# Patient Record
Sex: Female | Born: 1990 | Race: Black or African American | Hispanic: No | Marital: Single | Smoking: Current every day smoker
Health system: Southern US, Community
[De-identification: ages and names within clinical notes are randomized; demographics above are authoritative.]

## PROBLEM LIST (undated history)

## (undated) DIAGNOSIS — L732 Hidradenitis suppurativa: Secondary | ICD-10-CM

## (undated) DIAGNOSIS — F419 Anxiety disorder, unspecified: Secondary | ICD-10-CM

## (undated) HISTORY — DX: Hidradenitis suppurativa: L73.2

## (undated) HISTORY — PX: NO PAST SURGERIES: SHX2092

---
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
## 2008-05-28 ENCOUNTER — Emergency Department (HOSPITAL_COMMUNITY): Admission: EM | Admit: 2008-05-28 | Discharge: 2008-05-28 | Payer: Self-pay | Admitting: Emergency Medicine

## 2010-02-15 ENCOUNTER — Encounter: Payer: Self-pay | Admitting: Obstetrics & Gynecology

## 2010-06-10 ENCOUNTER — Emergency Department (HOSPITAL_COMMUNITY)
Admission: EM | Admit: 2010-06-10 | Discharge: 2010-06-11 | Disposition: A | Discharge status: Home / Self Care | Payer: Medicaid Other | Attending: Emergency Medicine | Admitting: Emergency Medicine

## 2010-06-10 DIAGNOSIS — N390 Urinary tract infection, site not specified: Secondary | ICD-10-CM | POA: Insufficient documentation

## 2010-06-10 DIAGNOSIS — K2920 Alcoholic gastritis without bleeding: Secondary | ICD-10-CM | POA: Insufficient documentation

## 2010-06-10 DIAGNOSIS — F1020 Alcohol dependence, uncomplicated: Secondary | ICD-10-CM | POA: Insufficient documentation

## 2010-06-10 LAB — URINE MICROSCOPIC-ADD ON

## 2010-06-10 LAB — CBC
MCHC: 34.6 g/dL (ref 30.0–36.0)
Platelets: 244 10*3/uL (ref 150–400)
RBC: 5.24 MIL/uL — ABNORMAL HIGH (ref 3.87–5.11)
WBC: 14.9 10*3/uL — ABNORMAL HIGH (ref 4.0–10.5)

## 2010-06-10 LAB — URINALYSIS, ROUTINE W REFLEX MICROSCOPIC
Bilirubin Urine: NEGATIVE
Glucose, UA: NEGATIVE mg/dL
Hgb urine dipstick: NEGATIVE
Nitrite: POSITIVE — AB

## 2010-06-10 LAB — BASIC METABOLIC PANEL
Calcium: 10 mg/dL (ref 8.4–10.5)
GFR calc non Af Amer: >60 mL/min (ref >60)
Potassium: 3.3 mEq/L — ABNORMAL LOW (ref 3.5–5.1)
Sodium: 134 mEq/L — ABNORMAL LOW (ref 135–145)

## 2010-06-10 LAB — POCT PREGNANCY, URINE: Preg Test, Ur: NEGATIVE

## 2010-06-10 LAB — DIFFERENTIAL
Eosinophils Absolute: 0 10*3/uL (ref 0.0–0.7)
Eosinophils Relative: 0 % (ref 0–5)
Lymphocytes Relative: 7 % — ABNORMAL LOW (ref 12–46)

## 2010-06-12 NOTE — Discharge Summary (Signed)
Sara [name], Sara [name] NO.:  [id number]   MEDICAL RECORD NO.:  [id number]          PATIENT TYPE:  INP   LOCATION:  9152                          FACILITY:  [facility]   PHYSICIAN:  Charles A. Clearance Coots, M.D.DATE OF BIRTH:  Oct 21, 1990   DATE OF ADMISSION:  01/13/2006  DATE OF DISCHARGE:  01/16/2006                               DISCHARGE SUMMARY   ADMITTING DIAGNOSES:  1. Thirty-five-weeks gestation.  2. Pregnancy-induced hypertension.  3. Rule out mild preeclampsia.   DISCHARGE DIAGNOSES:  1. Thirty-five-weeks gestation.  2. Pregnancy-induced hypertension.  3. Rule out mild preeclampsia.  4. Discharged home at [redacted] weeks gestation undelivered, blood pressures      much improved, in good condition   REASON FOR ADMISSION:  A 20 year old para 84 female with an estimated  date of confinement of February 13, 2006, admitted with pregnancy-induced  hypertension.  The patient had onset of care in our office at [redacted] weeks  gestation.  Her initial blood pressure was 129/69.  Subsequent blood  pressures were 140s over 70s.  At her office visit today her blood  pressure was 151/90.  Urine dip was negative for protein.  She had a  mild headache and that had resolved prior to her exam in the office.  A  PIH panel on December 12 was remarkable for uric acid of 6.1.  The  remainder of the labs were normal.   PAST MEDICAL HISTORY:  Surgery:  None.  Illnesses:  None.   MEDICATIONS:  Prenatal vitamins.   ALLERGIES:  No known drug allergies.   SOCIAL HISTORY:  Single.  Negative for tobacco, alcohol or recreational  drug use.   GYNECOLOGIC HISTORY:  Unremarkable.   OBSTETRICAL HISTORY:  She has had one voluntary termination of  pregnancy.   PHYSICAL EXAMINATION:  GENERAL:  Well-nourished, well-developed female  in no acute distress.  VITAL SIGNS:  Blood pressure 151/90.  Urine dip was negative for protein  and glucose.  ABDOMEN:  Gravid, nontender.  LUNGS:  Clear to auscultation  bilaterally.  HEART:  Regular rate and rhythm.  PELVIC:  Exam was deferred.  EXTREMITIES: Without significant lower extremity edema.   Nonstress test in the office:  Baseline 160-170 beats per minute with  accelerations, no decelerations.   ADMITTING LABORATORY VALUES:  Hemoglobin 12; hematocrit 35; white blood  cell count 6400; platelets 215,000.  Comprehensive metabolic panel was  remarkable for uric acid of 6.9.  Urinalysis revealed negative protein,  specific gravity of 1.010.   HOSPITAL COURSE:  The patient was admitted on bedrest and 24-hour urine  was started.  Blood pressures remained stable in the hospital.  The 24-  hour urine result revealed total protein of 126 mg per 24 hours,  creatinine clearance was 160 mL per minute.  Biophysical profile was  done which was within normal limits with an amniotic fluid index of 10.8  and a biophysical profile ultrasound score of 8/8.  The patient  continued to do well with blood pressures and was discharged home on  hospital day #4 in good condition, undelivered, at [redacted] weeks gestation.   DISCHARGE DISPOSITION:  Continue modified  bedrest at home.  The patient  is to call office for a followup appointment in 1 week.      Charles A. Clearance Coots, M.D.  Electronically Signed     CAH/MEDQ  D:  02/24/2006  T:  02/24/2006  Job:  [id number]

## 2010-06-12 NOTE — H&P (Signed)
Sara [name], [name] NO.:  [id number]   MEDICAL RECORD NO.:  [id number]          PATIENT TYPE:  INP   LOCATION:  9152                          FACILITY:  [facility]   PHYSICIAN:  Roseanna Rainbow, M.D.DATE OF BIRTH:  13-Feb-1990   DATE OF ADMISSION:  01/13/2006  DATE OF DISCHARGE:                              HISTORY & PHYSICAL   CHIEF COMPLAINT:  The patient is a para 0 with an estimated date of  confinement of February 13, 2006 with pregnancy-induced hypertension.   HISTORY OF PRESENT ILLNESS:  The patient had onset of care in our office  at 32 weeks. Her initial blood pressure was 129/69. Subsequent blood  pressures were 140s/70s. At our visit today, her blood pressure was  151/90. Urine dip was negative for protein. She had had a mild headache  that had resolved prior to her exam in the office. A PIH panel on  December 12 was remarkable for a uric acid of 6.1. The remainder of the  labs were normal.   ALLERGIES:  No known drug allergies.   MEDICATIONS:  Please see the medication reconciliation form.   RISK FACTORS:  Pregnancy-induced hypertension, positive chlamydia DNA  probe. Fetal pyelectasis and sickle cell positive.   PRENATAL LABORATORY DATA:  Please see the above. GC probe negative.  Hepatitis B surface antigen negative. Hematocrit 35.7, hemoglobin 11.9.  HIV nonreactive. Pap smear negative. Platelets 186,000. Blood type O  positive, antibody screen negative. RPR nonreactive. Rubella immune.  Sickle cell positive. Urine culture and sensitivity:  No growth.  Ultrasound on December 13:  Estimated fetal weight 25th of 50th  percentile; left renal pyelectasis was noted; the amniotic fluid index  was normal. The caliectasis measured 1.4 cm at the level of the renal  pelvis. 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   PAST GYNECOLOGIC HISTORY:  Unremarkable.   PAST MEDICAL HISTORY:  No significant history of medical  diseases.   PAST SURGICAL HISTORY:  No previous surgery.   SOCIAL HISTORY:  She is single. Does not give any significant history of  alcohol usage. Has no significant smoking history. Denies illicit drug  use.   PAST OBSTETRICAL HISTORY:  She has a history of a voluntary termination  of pregnancy.   PHYSICAL EXAMINATION:  Blood pressure 151/90, weight 150 pounds. Urine  dip negative for protein, glucose, red blood cells and ketones.  GENERAL:  Well-developed in no acute distress.  ABDOMEN:  Gravid, nontender.  PELVIC:  Deferred.  EXTREMITIES:  No significant lower extremity edema.   Nonstress test in the office:  Fetal heart rate baseline 170 with  accelerations, no decelerations.   ASSESSMENT:  Intrauterine pregnancy at 35+ weeks with pregnancy-induced  hypertension, rule out mild preeclampsia.   PLAN:  Admission with heightened maternal and fetal surveillance.      Roseanna Rainbow, M.D.  Electronically Signed     LAJ/MEDQ  D:  01/14/2006  T:  01/14/2006  Job:  [id number]

## 2012-01-20 ENCOUNTER — Encounter (HOSPITAL_COMMUNITY): Payer: Self-pay | Admitting: *Deleted

## 2012-01-20 ENCOUNTER — Emergency Department (HOSPITAL_COMMUNITY)
Admission: EM | Admit: 2012-01-20 | Discharge: 2012-01-20 | Disposition: A | Discharge status: Home / Self Care | Payer: Self-pay | Attending: Emergency Medicine | Admitting: Emergency Medicine

## 2012-01-20 DIAGNOSIS — B86 Scabies: Secondary | ICD-10-CM | POA: Insufficient documentation

## 2012-01-20 DIAGNOSIS — F17200 Nicotine dependence, unspecified, uncomplicated: Secondary | ICD-10-CM | POA: Insufficient documentation

## 2012-01-20 MED ORDER — DIPHENHYDRAMINE HCL 25 MG PO CAPS: 25.0000 mg | ORAL_CAPSULE | Freq: Four times a day (QID) | ORAL | Status: DC | PRN

## 2012-01-20 MED ORDER — PERMETHRIN 5 % EX CREA: TOPICAL_CREAM | CUTANEOUS | Status: DC

## 2012-01-20 NOTE — ED Notes (Signed)
She has had a rash over her body for 2 weeks she thinks she has scabies.

## 2012-01-20 NOTE — ED Provider Notes (Signed)
History  This chart was scribed for Sara Racer, MD by Bennett Scrape, ED Scribe. This patient was seen in room TR04C/TR04C and the patient's care was started at 5:05 PM.  CSN: [id number]  Arrival date & time 01/20/12  1606   First MD Initiated Contact with Patient 01/20/12 1705      Chief Complaint  Patient presents with  . Rash    Patient is a 21 y.o. female presenting with rash. The history is provided by the patient. No language interpreter was used.  Rash  This is a new problem. The current episode started more than 1 week ago. The problem has been gradually worsening. The problem is associated with an unknown factor. There has been no fever. The rash is present on the trunk, left hand, left arm, abdomen, right arm, right hand, right upper leg, right lower leg, left upper leg and left lower leg. The patient is experiencing no pain. Associated symptoms include itching.    Sara [name] is a 21 y.o. female who presents to the Emergency Department complaining of approximate;y 2 weeks of gradual onset, gradually worsening, constant rash. She denies having prior episodes of similar symptoms but reports that she has sick contacts with similar symptoms diagnosed as scabies. She reports using Vaseline with no improvement. She denies any other symptoms currently. She does not have a h/o chronic medical conditions. She is a current everyday smoker and occasional alcohol user.  History reviewed. No pertinent past medical history.  History reviewed. No pertinent past surgical history.  No family history on file.  History  Substance Use Topics  . Smoking status: Current Every Day Smoker  . Smokeless tobacco: Not on file  . Alcohol Use: Yes    No OB history provided.  Review of Systems  Constitutional: Negative for fever and chills.  Gastrointestinal: Negative for nausea and vomiting.  Skin: Positive for itching and rash.  All other systems reviewed and are  negative.    Allergies  Review of patient's allergies indicates no known allergies.  Home Medications   Current Outpatient Rx  Name  Route  Sig  Dispense  Refill  . DIPHENHYDRAMINE HCL 25 MG PO CAPS   Oral   Take 1 capsule (25 mg total) by mouth every 6 (six) hours as needed for itching.   30 capsule   0   . PERMETHRIN 5 % EX CREA      Apply to affected area once   10 g   0     Triage Vitals: BP 108/78  Pulse 94  Temp 98 F (36.7 C) (Oral)  Resp 18  SpO2 99%  Physical Exam  Nursing note and vitals reviewed. Constitutional: She is oriented to person, place, and time. She appears well-developed and well-nourished. No distress.  HENT:  Head: Normocephalic and atraumatic.  Eyes: EOM are normal.  Neck: Neck supple. No tracheal deviation present.  Cardiovascular: Normal rate.   Pulmonary/Chest: Effort normal. No respiratory distress.  Musculoskeletal: Normal range of motion.  Neurological: She is alert and oriented to person, place, and time.  Skin: Skin is warm and dry. Rash noted.       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 Her behavior is normal.    ED Course  Procedures (including critical care time)  DIAGNOSTIC STUDIES: Oxygen Saturation is 99% on room air, normal by my interpretation.    COORDINATION OF CARE: 5:15 PM-  Discussed treatment plan which includes cream and cleaning her living area with pt at bedside and pt agreed to plan.  5:45 PM- Prescribed 5% Elimite cream and 25 mg benadryl capsule  Labs Reviewed - No data to display No results found.   1. Scabies       MDM  I personally performed the services described in this documentation, which was scribed in my presence. The recorded information has been reviewed and is accurate.       Sara Racer, MD 01/20/12 [phone]

## 2012-09-03 ENCOUNTER — Encounter (HOSPITAL_COMMUNITY): Payer: Self-pay | Admitting: Nurse Practitioner

## 2012-09-03 ENCOUNTER — Emergency Department (HOSPITAL_COMMUNITY): Payer: Self-pay

## 2012-09-03 ENCOUNTER — Emergency Department (HOSPITAL_COMMUNITY)
Admission: EM | Admit: 2012-09-03 | Discharge: 2012-09-03 | Disposition: A | Discharge status: Home / Self Care | Payer: Self-pay | Attending: Emergency Medicine | Admitting: Emergency Medicine

## 2012-09-03 DIAGNOSIS — R109 Unspecified abdominal pain: Secondary | ICD-10-CM | POA: Insufficient documentation

## 2012-09-03 DIAGNOSIS — G8929 Other chronic pain: Secondary | ICD-10-CM | POA: Insufficient documentation

## 2012-09-03 DIAGNOSIS — M545 Low back pain, unspecified: Secondary | ICD-10-CM | POA: Insufficient documentation

## 2012-09-03 DIAGNOSIS — F17200 Nicotine dependence, unspecified, uncomplicated: Secondary | ICD-10-CM | POA: Insufficient documentation

## 2012-09-03 DIAGNOSIS — R110 Nausea: Secondary | ICD-10-CM | POA: Insufficient documentation

## 2012-09-03 LAB — URINALYSIS, ROUTINE W REFLEX MICROSCOPIC
Bilirubin Urine: NEGATIVE
Glucose, UA: NEGATIVE mg/dL
Ketones, ur: NEGATIVE mg/dL
Nitrite: POSITIVE — AB
Protein, ur: NEGATIVE mg/dL
Specific Gravity, Urine: 1.016 (ref 1.005–1.030)
Urobilinogen, UA: 0.2 mg/dL (ref 0.0–1.0)
pH: 5.5 (ref 5.0–8.0)

## 2012-09-03 LAB — URINE MICROSCOPIC-ADD ON

## 2012-09-03 MED ORDER — MORPHINE SULFATE 4 MG/ML IJ SOLN: 4.0000 mg | Freq: Once | INTRAMUSCULAR | Status: DC

## 2012-09-03 MED ORDER — HYDROCODONE-ACETAMINOPHEN 5-325 MG PO TABS
2.0000 | ORAL_TABLET | Freq: Once | ORAL | Status: AC
  Administered 2012-09-03: 2 via ORAL
  Filled 2012-09-03: qty 2

## 2012-09-03 MED ORDER — ACETAMINOPHEN 500 MG PO TABS: 500.0000 mg | ORAL_TABLET | Freq: Four times a day (QID) | ORAL | Status: DC | PRN

## 2012-09-03 NOTE — ED Notes (Signed)
Discharge instructions reviewed. Pt verbalized understanding.  

## 2012-09-03 NOTE — ED Notes (Signed)
Pt c/o generalized abd pain for 6 years, increased with sneezing and laughing, onset after she had a child. ems noted no deformities, no tenderness on palpation. vss en route

## 2012-09-03 NOTE — ED Provider Notes (Signed)
Medical screening examination/treatment/procedure(s) were conducted as a shared visit with non-physician practitioner(s) and myself.  I personally evaluated the patient during the encounter   Darlys Gales, MD 09/03/12 2049

## 2012-09-03 NOTE — ED Provider Notes (Signed)
CSN: [id number]     Arrival date & time 09/03/12  1452 History     First MD Initiated Contact with Patient 09/03/12 1534     Chief Complaint  Patient presents with  . Abdominal Pain   (Consider location/radiation/quality/duration/timing/severity/associated sxs/prior Treatment) HPI Pt is a 21yo female c/o intermittent right-sided abdominal pain as well as LBP for the past 89yrs, which started after giving birth to her son in 2008.  Pt states pain worse with coughing, sneezing, and deep breathing.  Aching and sharp on occation, 8/10. Ibuprofen provides some relief.  Reports occasional nausea.  Denies previous evaluation for symptoms.  Denies recent injury.  Denies fever, vomiting, or diarrhea.  Denies urinary symptoms, vaginal discharge or pain.  Denies previous abdominal surgeries.  LMP-currently on.  History reviewed. No pertinent past medical history. History reviewed. No pertinent past surgical history. History reviewed. No pertinent family history. History  Substance Use Topics  . Smoking status: Current Every Day Smoker  . Smokeless tobacco: Not on file  . Alcohol Use: Yes   OB History   Grav Para Term Preterm Abortions TAB SAB Ect Mult Living                 Review of Systems  Constitutional: Negative for fever, chills and appetite change.  Gastrointestinal: Positive for nausea and abdominal pain. Negative for vomiting, diarrhea and constipation.  Genitourinary: Positive for flank pain ( right). Negative for dysuria, urgency, frequency, hematuria and pelvic pain.  Musculoskeletal: Positive for back pain.  All other systems reviewed and are negative.    Allergies  Review of patient's allergies indicates no known allergies.  Home Medications   Current Outpatient Rx  Name  Route  Sig  Dispense  Refill  . acetaminophen (TYLENOL) 500 MG tablet   Oral   Take 1 tablet (500 mg total) by mouth every 6 (six) hours as needed for pain.   30 tablet   0    BP 112/70  Pulse  95  Temp(Src) 98.9 F (37.2 C) (Oral)  Resp 16  SpO2 99% Physical Exam  Nursing note and vitals reviewed. Constitutional: She appears well-developed and well-nourished. No distress.  HENT:  Head: Normocephalic and atraumatic.  Eyes: Conjunctivae are normal. No scleral icterus.  Neck: Normal range of motion.  Cardiovascular: Normal rate, regular rhythm and normal heart sounds.   Pulmonary/Chest: Effort normal and breath sounds normal. No respiratory distress. She has no wheezes. She has no rales. She exhibits no tenderness.  Abdominal: Soft. Bowel sounds are normal. She exhibits no distension and no mass. There is tenderness ( mild, just right of umbilicus.  ). There is no rebound and no guarding.  Soft, nl bowel sounds, no masses palpated on exam but TTP where pt reports hx of "bulge"   Musculoskeletal: Normal range of motion.  Neurological: She is alert.  Skin: Skin is warm and dry. She is not diaphoretic.    ED Course   Procedures (including critical care time)  Labs Reviewed  URINALYSIS, ROUTINE W REFLEX MICROSCOPIC - Abnormal; Notable for the following:    APPearance CLOUDY (*)    Hgb urine dipstick LARGE (*)    Nitrite POSITIVE (*)    Leukocytes, UA TRACE (*)    All other components within normal limits  URINE MICROSCOPIC-ADD ON - Abnormal; Notable for the following:    Squamous Epithelial / LPF FEW (*)    Bacteria, UA MANY (*)    All other components within normal limits  URINE  CULTURE   US Abdomen Complete  09/03/2012   *RADIOLOGY REPORT*  Clinical Data:  Pain and focal bulge along the right abdomen.  COMPLETE ABDOMINAL ULTRASOUND  Comparison:  None.  Findings:  Gallbladder:  No gallstones, gallbladder wall thickening, or pericholecystic fluid.  Common bile duct:  Measures 3 mm in diameter, within normal limits.  Liver:  No focal lesion identified.  Within normal limits in parenchymal echogenicity.  IVC:  Appears normal.  Pancreas:  No focal abnormality seen.  Spleen:   Measures 11.1 cm craniocaudad and appears normal.  Right Kidney:  Measures 10.5 cm in length and appears normal.  Left Kidney:  Measures 11.8 cm in length and appears normal.  Abdominal aorta:  No aneurysm identified.  Other:  No hernia identified along the region of interest in the right flank.  IMPRESSION: Negative abdominal ultrasound.   Original Report Authenticated By: Gaylyn Rong, M.D.   1. Right sided abdominal pain     MDM  Suspicion for non-incarcerated abdominal hernia.  Not concerned for surgical abdomen at this time. Discussed pt with Dr. Redgie Grayer. Will get UA and U/S.    UA: unremarkable as pt is on menstrual cycle.   U/S: unremarkable.    Due to intermittent duration of this pain for 78yrs and no obvious cause found today.  Will have pt f/u with [facility] to establish PCP for further evaluation and any appropriate referral.   Junius Finner, PA-C 09/03/12 1931

## 2012-09-03 NOTE — ED Notes (Signed)
Pt c/o generalized abd and back pain that started in 2008. Pt states pain has been increased for the past two days with coughing and deep breaths. Pt reports that it feels like a tightness or like something has "shifted" in her abd. Pt states she has not taken anything for the pain and has not seen a dr for these symptoms due to the pain going away.

## 2012-09-03 NOTE — ED Notes (Addendum)
Pt denies nausea, vomiting, or diarrhea. Pt denies any vaginal discharge but states she is on her menstrual cycle.

## 2012-09-05 LAB — URINE CULTURE: Colony Count: >=100000

## 2012-09-06 NOTE — ED Notes (Signed)
+   Urine Culture No treatment needed per Hannah Merrill 

## 2013-05-21 ENCOUNTER — Emergency Department (HOSPITAL_COMMUNITY): Payer: Medicaid Other

## 2013-05-21 ENCOUNTER — Encounter (HOSPITAL_COMMUNITY): Payer: Self-pay | Admitting: Emergency Medicine

## 2013-05-21 ENCOUNTER — Emergency Department (HOSPITAL_COMMUNITY)
Admission: EM | Admit: 2013-05-21 | Discharge: 2013-05-21 | Disposition: A | Discharge status: Home / Self Care | Payer: Self-pay | Attending: Emergency Medicine | Admitting: Emergency Medicine

## 2013-05-21 DIAGNOSIS — S93609A Unspecified sprain of unspecified foot, initial encounter: Secondary | ICD-10-CM | POA: Insufficient documentation

## 2013-05-21 DIAGNOSIS — S93602A Unspecified sprain of left foot, initial encounter: Secondary | ICD-10-CM

## 2013-05-21 DIAGNOSIS — F17200 Nicotine dependence, unspecified, uncomplicated: Secondary | ICD-10-CM | POA: Insufficient documentation

## 2013-05-21 DIAGNOSIS — Y929 Unspecified place or not applicable: Secondary | ICD-10-CM | POA: Insufficient documentation

## 2013-05-21 DIAGNOSIS — Y939 Activity, unspecified: Secondary | ICD-10-CM | POA: Insufficient documentation

## 2013-05-21 DIAGNOSIS — X58XXXA Exposure to other specified factors, initial encounter: Secondary | ICD-10-CM | POA: Insufficient documentation

## 2013-05-21 MED ORDER — IBUPROFEN 400 MG PO TABS
800.0000 mg | ORAL_TABLET | Freq: Once | ORAL | Status: AC
  Administered 2013-05-21: 800 mg via ORAL
  Filled 2013-05-21: qty 2

## 2013-05-21 NOTE — ED Notes (Addendum)
Pt presents with Left foot numbness starting last night, pt denies any injury

## 2013-05-21 NOTE — ED Provider Notes (Signed)
Medical screening examination/treatment/procedure(s) were performed by non-physician practitioner and as supervising physician I was immediately available for consultation/collaboration.   EKG Interpretation None        Richardean Canalavid H Carrah Eppolito, MD 05/21/13 [phone]

## 2013-05-21 NOTE — ED Notes (Signed)
Called x 2 for patient.   No answer.  

## 2013-05-21 NOTE — Discharge Instructions (Signed)
Take ibuprofen 600-800 mg every 6-8 hours respectively as needed for pain.  Foot Sprain The muscles and cord like structures which attach muscle to bone (tendons) that surround the feet are made up of units. A foot sprain can occur at the weakest spot in any of these units. This condition is most often caused by injury to or overuse of the foot, as from playing contact sports, or aggravating a previous injury, or from poor conditioning, or obesity. SYMPTOMS  Pain with movement of the foot.  Tenderness and swelling at the injury site.  Loss of strength is present in moderate or severe sprains. THE THREE GRADES OR SEVERITY OF FOOT SPRAIN ARE:  Mild (Grade I): Slightly pulled muscle without tearing of muscle or tendon fibers or loss of strength.  Moderate (Grade II): Tearing of fibers in a muscle, tendon, or at the attachment to bone, with small decrease in strength.  Severe (Grade III): Rupture of the muscle-tendon-bone attachment, with separation of fibers. Severe sprain requires surgical repair. Often repeating (chronic) sprains are caused by overuse. Sudden (acute) sprains are caused by direct injury or over-use. DIAGNOSIS  Diagnosis of this condition is usually by your own observation. If problems continue, a caregiver may be required for further evaluation and treatment. X-rays may be required to make sure there are not breaks in the bones (fractures) present. Continued problems may require physical therapy for treatment. PREVENTION  Use strength and conditioning exercises appropriate for your sport.  Warm up properly prior to working out.  Use athletic shoes that are made for the sport you are participating in.  Allow adequate time for healing. Early return to activities makes repeat injury more likely, and can lead to an unstable arthritic foot that can result in prolonged disability. Mild sprains generally heal in 3 to 10 days, with moderate and severe sprains taking 2 to 10 weeks.  Your caregiver can help you determine the proper time required for healing. HOME CARE INSTRUCTIONS   Apply ice to the injury for 15-20 minutes, 03-04 times per day. Put the ice in a plastic bag and place a towel between the bag of ice and your skin.  An elastic wrap (like an Ace bandage) may be used to keep swelling down.  Keep foot above the level of the heart, or at least raised on a footstool, when swelling and pain are present.  Try to avoid use other than gentle range of motion while the foot is painful. Do not resume use until instructed by your caregiver. Then begin use gradually, not increasing use to the point of pain. If pain does develop, decrease use and continue the above measures, gradually increasing activities that do not cause discomfort, until you gradually achieve normal use.  Use crutches if and as instructed, and for the length of time instructed.  Keep injured foot and ankle wrapped between treatments.  Massage foot and ankle for comfort and to keep swelling down. Massage from the toes up towards the knee.  Only take over-the-counter or prescription medicines for pain, discomfort, or fever as directed by your caregiver. SEEK IMMEDIATE MEDICAL CARE IF:   Your pain and swelling increase, or pain is not controlled with medications.  You have loss of feeling in your foot or your foot turns cold or blue.  You develop new, unexplained symptoms, or an increase of the symptoms that brought you to your caregiver. MAKE SURE YOU:   Understand these instructions.  Will watch your condition.  Will get help  right away if you are not doing well or get worse. Document Released: 07/03/2001 Document Revised: 04/05/2011 Document Reviewed: 08/31/2007 [facility] Patient Information 2014 [place], [place].
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
## 2016-01-20 ENCOUNTER — Encounter (HOSPITAL_COMMUNITY): Payer: Self-pay

## 2016-01-20 ENCOUNTER — Emergency Department (HOSPITAL_COMMUNITY)
Admission: EM | Admit: 2016-01-20 | Discharge: 2016-01-21 | Disposition: A | Discharge status: Home / Self Care | Payer: Self-pay | Attending: Emergency Medicine | Admitting: Emergency Medicine

## 2016-01-20 DIAGNOSIS — F17200 Nicotine dependence, unspecified, uncomplicated: Secondary | ICD-10-CM | POA: Insufficient documentation

## 2016-01-20 DIAGNOSIS — X58XXXA Exposure to other specified factors, initial encounter: Secondary | ICD-10-CM

## 2016-01-20 DIAGNOSIS — D649 Anemia, unspecified: Secondary | ICD-10-CM | POA: Insufficient documentation

## 2016-01-20 DIAGNOSIS — A599 Trichomoniasis, unspecified: Secondary | ICD-10-CM | POA: Insufficient documentation

## 2016-01-20 DIAGNOSIS — N939 Abnormal uterine and vaginal bleeding, unspecified: Secondary | ICD-10-CM

## 2016-01-20 LAB — COMPREHENSIVE METABOLIC PANEL
ALT: 30 U/L (ref 14–54)
ANION GAP: 10 (ref 5–15)
AST: 28 U/L (ref 15–41)
Albumin: 4 g/dL (ref 3.5–5.0)
Alkaline Phosphatase: 72 U/L (ref 38–126)
BUN: 10 mg/dL (ref 6–20)
CALCIUM: 9.9 mg/dL (ref 8.9–10.3)
CO2: 25 mmol/L (ref 22–32)
CREATININE: 0.83 mg/dL (ref 0.44–1.00)
Chloride: 102 mmol/L (ref 101–111)
Glucose, Bld: 93 mg/dL (ref 65–99)
Potassium: 3.9 mmol/L (ref 3.5–5.1)
SODIUM: 137 mmol/L (ref 135–145)
Total Bilirubin: 0.4 mg/dL (ref 0.3–1.2)
Total Protein: 7.8 g/dL (ref 6.5–8.1)

## 2016-01-20 LAB — URINALYSIS, ROUTINE W REFLEX MICROSCOPIC
BILIRUBIN URINE: NEGATIVE
GLUCOSE, UA: NEGATIVE mg/dL
Hgb urine dipstick: NEGATIVE
KETONES UR: NEGATIVE mg/dL
Nitrite: NEGATIVE
PH: 5 (ref 5.0–8.0)
PROTEIN: NEGATIVE mg/dL
Specific Gravity, Urine: 1.009 (ref 1.005–1.030)

## 2016-01-20 LAB — CBC
HCT: 28.9 % — ABNORMAL LOW (ref 36.0–46.0)
HEMOGLOBIN: 9.3 g/dL — AB (ref 12.0–15.0)
MCH: 26.3 pg (ref 26.0–34.0)
MCHC: 32.2 g/dL (ref 30.0–36.0)
MCV: 81.9 fL (ref 78.0–100.0)
PLATELETS: 477 10*3/uL — AB (ref 150–400)
RBC: 3.53 MIL/uL — AB (ref 3.87–5.11)
RDW: 13.7 % (ref 11.5–15.5)
WBC: 10 10*3/uL (ref 4.0–10.5)

## 2016-01-20 LAB — I-STAT BETA HCG BLOOD, ED (MC, WL, AP ONLY)

## 2016-01-20 LAB — LIPASE, BLOOD: LIPASE: 20 U/L (ref 11–51)

## 2016-01-20 NOTE — ED Triage Notes (Signed)
Pt presents for evaluation of vaginal bleeding x 1 month and abd pain. Pt. States had month long menstrual cycle in October, Pt states last cycle started late November and has just ended today. Pt. States she has had some generalized weakness/fatigue since. Pt AxO x4. No hx of anemia.

## 2016-01-21 ENCOUNTER — Emergency Department (HOSPITAL_COMMUNITY): Payer: Self-pay

## 2016-01-21 LAB — GC/CHLAMYDIA PROBE AMP (~~LOC~~) NOT AT ARMC
Chlamydia: NEGATIVE
Neisseria Gonorrhea: NEGATIVE

## 2016-01-21 LAB — WET PREP, GENITAL
SPERM: NONE SEEN
Yeast Wet Prep HPF POC: NONE SEEN

## 2016-01-21 MED ORDER — METRONIDAZOLE 500 MG PO TABS
2000.0000 mg | ORAL_TABLET | Freq: Once | ORAL | Status: AC
  Administered 2016-01-21: 2000 mg via ORAL
  Filled 2016-01-21: qty 4

## 2016-01-21 MED ORDER — FERROUS SULFATE 325 (65 FE) MG PO TABS: 325.0000 mg | ORAL_TABLET | Freq: Every day | ORAL | 0 refills | Status: DC

## 2016-01-21 NOTE — ED Provider Notes (Signed)
[facility]-EMERGENCY DEPT Provider Note   CSN: [id number] Arrival date & time: 01/20/16  1707     History   Chief Complaint Chief Complaint  Patient presents with  . Vaginal Bleeding  . Abdominal Pain    HPI Sara [name] is a 25 y.o. female.  The history is provided by the patient and medical records. No language interpreter was used.  Vaginal Bleeding  Primary symptoms include vaginal bleeding.  Primary symptoms include no dysuria. Associated symptoms include abdominal pain. Pertinent negatives include no diarrhea, no nausea, no vomiting and no frequency.  Abdominal Pain   Pertinent negatives include fever, diarrhea, nausea, vomiting, dysuria, frequency and headaches.   Sara [name] is an otherwise healthy 25 y.o. female  who presents to the Emergency Department complaining of vaginal bleeding. Patient states that she started her menstrual cycle 2 days before Thanksgiving and bleeding persisted for approximately a month, ending yesterday. She endorses weakness and fatigue over the last 2 days. Today, she was standing up at work when she felt very lightheaded. She sat down and began to feel better. She denies vaginal discharge, but states that she has not completely sure given bleeding. Endorses intermittent lower abdominal cramping.No dysuria, urinary urgency/frequency, back pain.   History reviewed. No pertinent past medical history.  There are no active problems to display for this patient.   History reviewed. No pertinent surgical history.  OB History    No data available       Home Medications    Prior to Admission medications   Medication Sig Start Date End Date Taking? Authorizing Provider  ferrous sulfate 325 (65 FE) MG tablet Take 1 tablet (325 mg total) by mouth daily. 01/21/16   Chase PicketJaime Pilcher Ward, PA-C    Family History No family history on file.  Social History Social History  Substance Use Topics  . Smoking status: Current Every Day Smoker   . Smokeless tobacco: Not on file  . Alcohol use Yes     Allergies   Patient has no known allergies.   Review of Systems Review of Systems  Constitutional: Negative for chills and fever.  HENT: Negative for congestion.   Eyes: Negative for visual disturbance.  Respiratory: Negative for cough and shortness of breath.   Cardiovascular: Negative.   Gastrointestinal: Positive for abdominal pain. Negative for diarrhea, nausea and vomiting.  Genitourinary: Positive for vaginal bleeding. Negative for dysuria, frequency, urgency and vaginal discharge.  Musculoskeletal: Negative for back pain and neck pain.  Skin: Negative for rash.  Neurological: Negative for headaches.     Physical Exam Updated Vital Signs BP 114/67   Pulse 70   Temp 98.5 F (36.9 C) (Oral)   Resp 16   LMP 12/14/2015 (Within Days)   SpO2 100%   Physical Exam  Constitutional: She is oriented to person, place, and time. She appears well-developed and well-nourished. No distress.  HENT:  Head: Normocephalic and atraumatic.  Cardiovascular: Normal rate, regular rhythm and normal heart sounds.   No murmur heard. Pulmonary/Chest: Effort normal and breath sounds normal. No respiratory distress.  Abdominal: Soft. Bowel sounds are normal. She exhibits no distension.  No abdominal or CVA tenderness.  Genitourinary:  Genitourinary Comments: Chaperone present for exam.  No active bleeding or bleeding within vaginal vault.  + white vaginal discharge.  Mild right adnexal tenderness with no masses or fullness appreciated.  No CMT.   Neurological: She is alert and oriented to person, place, and time.  Skin: Skin is warm and  dry.  Nursing note and vitals reviewed.    ED Treatments / Results  Labs (all labs ordered are listed, but only abnormal results are displayed) Labs Reviewed  WET PREP, GENITAL - Abnormal; Notable for the following:       Result Value   Trich, Wet Prep PRESENT (*)    Clue Cells Wet Prep HPF  POC PRESENT (*)    WBC, Wet Prep HPF POC MODERATE (*)    All other components within normal limits  CBC - Abnormal; Notable for the following:    RBC 3.53 (*)    Hemoglobin 9.3 (*)    HCT 28.9 (*)    Platelets 477 (*)    All other components within normal limits  URINALYSIS, ROUTINE W REFLEX MICROSCOPIC - Abnormal; Notable for the following:    Color, Urine STRAW (*)    Leukocytes, UA SMALL (*)    Bacteria, UA FEW (*)    Squamous Epithelial / LPF 0-5 (*)    All other components within normal limits  LIPASE, BLOOD  COMPREHENSIVE METABOLIC PANEL  I-STAT BETA HCG BLOOD, ED ([facility], [facility], [facility] ONLY)  GC/CHLAMYDIA PROBE AMP ([place]) NOT AT [facility]    EKG  EKG Interpretation None       Radiology US Transvaginal Non-ob  Result Date: 01/21/2016 CLINICAL DATA:  Vaginal bleeding x1 month with pain. EXAM: TRANSABDOMINAL AND TRANSVAGINAL ULTRASOUND OF PELVIS DOPPLER ULTRASOUND OF OVARIES TECHNIQUE: Both transabdominal and transvaginal ultrasound examinations of the pelvis were performed. Transabdominal technique was performed for global imaging of the pelvis including uterus, ovaries, adnexal regions, and pelvic cul-de-sac. It was necessary to proceed with endovaginal exam following the transabdominal exam to visualize the endometrium and ovaries. Color and duplex Doppler ultrasound was utilized to evaluate blood flow to the ovaries. COMPARISON:  None. FINDINGS: Uterus Measurements: 7.8 x 4.5 x 4.6 cm. The uterus is slightly anteverted. No fibroids or other mass visualized. Endometrium Thickness: Slightly heterogeneous and irregular in appearance with measuring up to 13 mm. Interposed hypoechoic fluid and/or products of menstruation noted. No focal abnormality visualized. Right ovary Measurements: 3.2 x 1.7 x 3 cm. Normal appearance/no adnexal mass. Left ovary Measurements: 3 x 2.6 x 3 cm. There appears be an involuting cyst or follicle in the left ovary measuring approximately 1.6 x 2.1 x 1.3 cm.  Pulsed Doppler evaluation of both ovaries demonstrates normal low-resistance arterial and venous waveforms. Other findings No abnormal free fluid. IMPRESSION: Irregular slightly thickened appearance of the endometrium, nonspecific in etiology. Patient reports having stopped menses on the day of this exam. The irregular hypoechoic appearance within the endometrial cavity may represent some residual products of menstruation and/or fluid. Involuting follicle or cyst of the left ovary. No torsion of either ovary. Electronically Signed   By: Tollie Eth M.D.   On: 01/21/2016 01:11   US Pelvis Complete  Result Date: 01/21/2016 CLINICAL DATA:  Vaginal bleeding x1 month with pain. EXAM: TRANSABDOMINAL AND TRANSVAGINAL ULTRASOUND OF PELVIS DOPPLER ULTRASOUND OF OVARIES TECHNIQUE: Both transabdominal and transvaginal ultrasound examinations of the pelvis were performed. Transabdominal technique was performed for global imaging of the pelvis including uterus, ovaries, adnexal regions, and pelvic cul-de-sac. It was necessary to proceed with endovaginal exam following the transabdominal exam to visualize the endometrium and ovaries. Color and duplex Doppler ultrasound was utilized to evaluate blood flow to the ovaries. COMPARISON:  None. FINDINGS: Uterus Measurements: 7.8 x 4.5 x 4.6 cm. The uterus is slightly anteverted. No fibroids or other mass visualized. Endometrium Thickness:  Slightly heterogeneous and irregular in appearance with measuring up to 13 mm. Interposed hypoechoic fluid and/or products of menstruation noted. No focal abnormality visualized. Right ovary Measurements: 3.2 x 1.7 x 3 cm. Normal appearance/no adnexal mass. Left ovary Measurements: 3 x 2.6 x 3 cm. There appears be an involuting cyst or follicle in the left ovary measuring approximately 1.6 x 2.1 x 1.3 cm. Pulsed Doppler evaluation of both ovaries demonstrates normal low-resistance arterial and venous waveforms. Other findings No abnormal free  fluid. IMPRESSION: Irregular slightly thickened appearance of the endometrium, nonspecific in etiology. Patient reports having stopped menses on the day of this exam. The irregular hypoechoic appearance within the endometrial cavity may represent some residual products of menstruation and/or fluid. Involuting follicle or cyst of the left ovary. No torsion of either ovary. Electronically Signed   By: Tollie Ethavid  Kwon M.D.   On: 01/21/2016 01:11   [place] Art/ven Flow Abd Pelv Doppler  Result Date: 01/21/2016 CLINICAL DATA:  Vaginal bleeding x1 month with pain. EXAM: TRANSABDOMINAL AND TRANSVAGINAL ULTRASOUND OF PELVIS DOPPLER ULTRASOUND OF OVARIES TECHNIQUE: Both transabdominal and transvaginal ultrasound examinations of the pelvis were performed. Transabdominal technique was performed for global imaging of the pelvis including uterus, ovaries, adnexal regions, and pelvic cul-de-sac. It was necessary to proceed with endovaginal exam following the transabdominal exam to visualize the endometrium and ovaries. Color and duplex Doppler ultrasound was utilized to evaluate blood flow to the ovaries. COMPARISON:  None. FINDINGS: Uterus Measurements: 7.8 x 4.5 x 4.6 cm. The uterus is slightly anteverted. No fibroids or other mass visualized. Endometrium Thickness: Slightly heterogeneous and irregular in appearance with measuring up to 13 mm. Interposed hypoechoic fluid and/or products of menstruation noted. No focal abnormality visualized. Right ovary Measurements: 3.2 x 1.7 x 3 cm. Normal appearance/no adnexal mass. Left ovary Measurements: 3 x 2.6 x 3 cm. There appears be an involuting cyst or follicle in the left ovary measuring approximately 1.6 x 2.1 x 1.3 cm. Pulsed Doppler evaluation of both ovaries demonstrates normal low-resistance arterial and venous waveforms. Other findings No abnormal free fluid. IMPRESSION: Irregular slightly thickened appearance of the endometrium, nonspecific in etiology. Patient reports having  stopped menses on the day of this exam. The irregular hypoechoic appearance within the endometrial cavity may represent some residual products of menstruation and/or fluid. Involuting follicle or cyst of the left ovary. No torsion of either ovary. Electronically Signed   By: Tollie Ethavid  Kwon M.D.   On: 01/21/2016 01:11    Procedures Procedures (including critical care time)  Medications Ordered in ED Medications  metroNIDAZOLE (FLAGYL) tablet 2,000 mg (2,000 mg Oral Given 01/21/16 0144)     Initial Impression / Assessment and Plan / ED Course  I have reviewed the triage vital signs and the nursing notes.  Pertinent labs & imaging results that were available during my care of the patient were reviewed by me and considered in my medical decision making (see chart for details).  Clinical Course    Sara [name] is a 25 y.o. female who presents to ED for vaginal bleeding for approximately one month. Vaginal bleeding subsided yesterday and patient has noted no bleeding today. She has been experiencing fatigue and weakness. Hemoglobin of 9.3, hematocrit 28.9. No baseline values to compare. On exam, patient with no active bleeding. We'll start on iron supplements and give OB/GYN follow-up for anemia. Patient did have vaginal discharge on pelvic exam. Wet prep positive for trichomoniasis, clue cells, moderate WBCs. Patient treated with 2g Flagyl in ED.  Gonorrhea and chlamydia obtained and patient aware that she will be notified if results are positive. Given right adnexal tenderness on exam, as well as prolonged menstrual cycle, offered ultrasound in ED versus referral for OB/GYN follow-up. Patient liked to have ultrasound done today.  Imaging review:  IMPRESSION: Irregular slightly thickened appearance of the endometrium, nonspecific in etiology. Patient reports having stopped menses on the day of this exam. The irregular hypoechoic appearance within the endometrial cavity may represent some  residual products of menstruation and/or fluid. Involuting follicle or cyst of the left ovary.  Evaluation does not show pathology that would require ongoing emergent intervention or inpatient treatment. Patient is hemodynamically stable and mentating appropriately. Discussed findings and plan with patient/guardian who agrees with treatment plan as dictated. OBGYN referral given. Return precautions discussed and all questions answered.   Patient seen by and discussed with Dr. Erma Heritage who agrees with treatment plan.   Final Clinical Impressions(s) / ED Diagnoses   Final diagnoses:  Unknown cause of injury  Vaginal bleeding  Trichimoniasis    New Prescriptions Discharge Medication List as of 01/21/2016  1:38 AM    START taking these medications   Details  ferrous sulfate 325 (65 FE) MG tablet Take 1 tablet (325 mg total) by mouth daily., Starting Wed 01/21/2016, Print         [employer] [facility] Ward, PA-C 01/21/16 1610    Shaune Pollack, MD 01/21/16 1149
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
## 2016-01-21 NOTE — ED Notes (Signed)
Patient transported to Ultrasound 

## 2016-06-11 ENCOUNTER — Ambulatory Visit (HOSPITAL_COMMUNITY)
Admission: EM | Admit: 2016-06-11 | Discharge: 2016-06-11 | Disposition: A | Discharge status: Home / Self Care | Payer: Medicaid Other | Attending: Internal Medicine | Admitting: Internal Medicine

## 2016-06-11 ENCOUNTER — Encounter (HOSPITAL_COMMUNITY): Payer: Self-pay

## 2016-06-11 DIAGNOSIS — L0291 Cutaneous abscess, unspecified: Secondary | ICD-10-CM

## 2016-06-11 DIAGNOSIS — J069 Acute upper respiratory infection, unspecified: Secondary | ICD-10-CM

## 2016-06-11 DIAGNOSIS — B9789 Other viral agents as the cause of diseases classified elsewhere: Secondary | ICD-10-CM

## 2016-06-11 DIAGNOSIS — R05 Cough: Secondary | ICD-10-CM

## 2016-06-11 MED ORDER — BENZONATATE 100 MG PO CAPS: 100.0000 mg | ORAL_CAPSULE | Freq: Three times a day (TID) | ORAL | 0 refills | Status: DC

## 2016-06-11 MED ORDER — CEFTRIAXONE SODIUM 1 G IJ SOLR
INTRAMUSCULAR | Status: AC
  Filled 2016-06-11: qty 10

## 2016-06-11 MED ORDER — LIDOCAINE HCL (PF) 1 % IJ SOLN
INTRAMUSCULAR | Status: AC
  Filled 2016-06-11: qty 2

## 2016-06-11 MED ORDER — CEFTRIAXONE SODIUM 1 G IJ SOLR
1.0000 g | Freq: Once | INTRAMUSCULAR | Status: AC
  Administered 2016-06-11: 1 g via INTRAMUSCULAR

## 2016-06-11 MED ORDER — SULFAMETHOXAZOLE-TRIMETHOPRIM 800-160 MG PO TABS: 1.0000 | ORAL_TABLET | Freq: Two times a day (BID) | ORAL | 0 refills | Status: AC

## 2016-06-11 NOTE — ED Triage Notes (Signed)
Pt has boil under her right underarm and said she has recurrent skin infections and wanted to figure out why and just have it looked it. Doing warm compressions. Pt also having productive cough with yellow mucus, hot/cold spells. Did take dayquil. Has been sick since wed.

## 2016-06-11 NOTE — Discharge Instructions (Signed)
You have an abscess. The only definitive cure is to have this drained. However, this is been deferred tonight at your request. We have given you an injection of Rocephin, and I prescribed Bactrim. Take one tablet twice a day for 7 days. I also recommend warm compresses 15 minutes at a time for more times a day.  You most likely have a viral URI, I advise rest, plenty of fluids and management of symptoms with over the counter medicines. For symptoms you may take Tylenol as needed every 4-6 hours for body aches or fever, not to exceed 4,000 mg a day, Take mucinex or mucinex DM ever 12 hours with a full glass of water, you may use an inhaled steroid such as Flonase, 2 sprays each nostril once a day for congestion, or an antihistamine such as Claritin or Zyrtec once a day. For cough, I have prescribed a medication called Tessalon. Take 1 tablet every 8 hours as needed for your cough. Should your symptoms worsen or fail to resolve, follow up with your primary care provider or return to clinic.

## 2016-06-11 NOTE — ED Provider Notes (Signed)
CSN: [id number]     Arrival date & time 06/11/16  1731 History   First MD Initiated Contact with Patient 06/11/16 1839     Chief Complaint  Patient presents with  . Recurrent Skin Infections  . Cough   (Consider location/radiation/quality/duration/timing/severity/associated sxs/prior Treatment) 26 year old female presents to clinic with 2 chief complaints tonight the first is URI type symptoms, and 24-hour history of cough, clear, nonproductive, dry and hacking. Along with muscle aches, bodyaches, loss of appetite, and congestion. She denies any abdominal pain, no nausea, vomiting, diarrhea.  Second complaint is a abscess in the right axilla, is been present for approximately one week states it's very painful. States that has decreased some in size and she's been using warm compresses.    Cough  Associated symptoms: rhinorrhea and sore throat   Associated symptoms: no chest pain, no ear pain, no shortness of breath and no wheezing     History reviewed. No pertinent past medical history. History reviewed. No pertinent surgical history. No family history on file. Social History  Substance Use Topics  . Smoking status: Current Every Day Smoker  . Smokeless tobacco: Never Used  . Alcohol use Yes   OB History    No data available     Review of Systems  Constitutional: Positive for appetite change and fatigue.  HENT: Positive for congestion, rhinorrhea and sore throat. Negative for ear discharge, ear pain, sinus pain and sinus pressure.   Respiratory: Positive for cough. Negative for shortness of breath and wheezing.   Cardiovascular: Negative for chest pain and palpitations.  Gastrointestinal: Negative.   Musculoskeletal: Negative.   Skin:       abscess  Neurological: Negative.     Allergies  Patient has no known allergies.  Home Medications   Prior to Admission medications   Medication Sig Start Date End Date Taking? Authorizing Provider  benzonatate (TESSALON) 100 MG  capsule Take 1 capsule (100 mg total) by mouth every 8 (eight) hours. 06/11/16   Dorena Bodo, NP  ferrous sulfate 325 (65 FE) MG tablet Take 1 tablet (325 mg total) by mouth daily. 01/21/16   Ward, Chase Picket, PA-C  sulfamethoxazole-trimethoprim (BACTRIM DS,SEPTRA DS) 800-160 MG tablet Take 1 tablet by mouth 2 (two) times daily. 06/11/16 06/18/16  Dorena Bodo, NP   Meds Ordered and Administered this Visit   Medications  cefTRIAXone (ROCEPHIN) injection 1 g (1 g Intramuscular Given 06/11/16 1905)    BP 127/85 (BP Location: Left Arm)   Pulse (!) 110   Temp 100 F (37.8 C) (Oral)   Resp 20   LMP 05/31/2016 (Exact Date)   SpO2 99%  No data found.   Physical Exam  Constitutional: She appears well-developed and well-nourished. No distress.  HENT:  Head: Normocephalic and atraumatic.  Right Ear: Tympanic membrane and external ear normal.  Left Ear: Tympanic membrane and external ear normal.  Nose: Rhinorrhea present.  Eyes: Conjunctivae are normal. Right eye exhibits no discharge. Left eye exhibits no discharge.  Neck: Normal range of motion.  Cardiovascular: Normal rate and regular rhythm.   Pulmonary/Chest: Effort normal and breath sounds normal.  Lymphadenopathy:    She has no cervical adenopathy.  Neurological: She is alert.  Skin: Skin is warm and dry. Capillary refill takes less than 2 seconds. She is not diaphoretic.  Abscess in the right axilla, no red streaking noted, externally appears to be 3 cm in diameter.  Psychiatric: She has a normal mood and affect. Her behavior is normal.  Nursing  note and vitals reviewed.   Urgent Care Course     Procedures (including critical care time)  Labs Review Labs Reviewed - No data to display  Imaging Review No results found.     MDM   1. Viral URI with cough   2. Abscess    Viral URI with cough, given Tessalon for cough. Provided counseling on over-the-counter therapies for symptom management. Provided work  note.  Abscess right axilla, patient declined to have I&D tonight, patient advised I&D would be most likely the best course of treatment, however she insisted on not doing this tonight. Given injection of ceftriaxone, Bactrim, recommend warm compresses multiple times throughout the day, return to clinic for draining as needed.    Dorena BodoKennard, Alfreida Steffenhagen, NP 06/11/16 1932

## 2018-01-09 ENCOUNTER — Encounter (HOSPITAL_COMMUNITY): Payer: Self-pay

## 2018-01-09 ENCOUNTER — Emergency Department (HOSPITAL_COMMUNITY): Payer: Medicaid Other

## 2018-01-09 ENCOUNTER — Emergency Department (HOSPITAL_COMMUNITY)
Admission: EM | Admit: 2018-01-09 | Discharge: 2018-01-09 | Disposition: A | Discharge status: Home / Self Care | Payer: Medicaid Other | Attending: Emergency Medicine | Admitting: Emergency Medicine

## 2018-01-09 DIAGNOSIS — Z79899 Other long term (current) drug therapy: Secondary | ICD-10-CM | POA: Diagnosis not present

## 2018-01-09 DIAGNOSIS — R079 Chest pain, unspecified: Secondary | ICD-10-CM | POA: Diagnosis present

## 2018-01-09 DIAGNOSIS — N938 Other specified abnormal uterine and vaginal bleeding: Secondary | ICD-10-CM | POA: Diagnosis not present

## 2018-01-09 DIAGNOSIS — N939 Abnormal uterine and vaginal bleeding, unspecified: Secondary | ICD-10-CM

## 2018-01-09 DIAGNOSIS — F17210 Nicotine dependence, cigarettes, uncomplicated: Secondary | ICD-10-CM | POA: Diagnosis not present

## 2018-01-09 LAB — BASIC METABOLIC PANEL
ANION GAP: 10 (ref 5–15)
BUN: 8 mg/dL (ref 6–20)
CO2: 25 mmol/L (ref 22–32)
Calcium: 9.4 mg/dL (ref 8.9–10.3)
Chloride: 103 mmol/L (ref 98–111)
Creatinine, Ser: 0.9 mg/dL (ref 0.44–1.00)
GFR calc Af Amer: >60 mL/min (ref >60)
GFR calc non Af Amer: >60 mL/min (ref >60)
GLUCOSE: 95 mg/dL (ref 70–99)
Potassium: 3.9 mmol/L (ref 3.5–5.1)
Sodium: 138 mmol/L (ref 135–145)

## 2018-01-09 LAB — D-DIMER, QUANTITATIVE: D-Dimer, Quant: 0.42 ug/mL-FEU (ref 0.00–0.50)

## 2018-01-09 LAB — I-STAT TROPONIN, ED: Troponin i, poc: 0 ng/mL (ref 0.00–0.08)

## 2018-01-09 LAB — CBC
HCT: 41.1 % (ref 36.0–46.0)
Hemoglobin: 13.5 g/dL (ref 12.0–15.0)
MCH: 28.2 pg (ref 26.0–34.0)
MCHC: 32.8 g/dL (ref 30.0–36.0)
MCV: 86 fL (ref 80.0–100.0)
Platelets: 330 10*3/uL (ref 150–400)
RBC: 4.78 MIL/uL (ref 3.87–5.11)
RDW: 13.2 % (ref 11.5–15.5)
WBC: 7.3 10*3/uL (ref 4.0–10.5)
nRBC: 0 % (ref 0.0–0.2)

## 2018-01-09 LAB — I-STAT BETA HCG BLOOD, ED (MC, WL, AP ONLY): I-stat hCG, quantitative: <5 m[IU]/mL (ref <5)

## 2018-01-09 MED ORDER — ALUM & MAG HYDROXIDE-SIMETH 200-200-20 MG/5ML PO SUSP
30.0000 mL | Freq: Once | ORAL | Status: AC
  Administered 2018-01-09: 30 mL via ORAL
  Filled 2018-01-09: qty 30

## 2018-01-09 MED ORDER — FAMOTIDINE 20 MG PO TABS
40.0000 mg | ORAL_TABLET | Freq: Once | ORAL | Status: AC
  Administered 2018-01-09: 40 mg via ORAL
  Filled 2018-01-09: qty 2

## 2018-01-09 MED ORDER — FAMOTIDINE 20 MG PO TABS: 20.0000 mg | ORAL_TABLET | Freq: Two times a day (BID) | ORAL | 0 refills | Status: DC

## 2018-01-09 NOTE — ED Provider Notes (Signed)
[facility] Provider Note   CSN: [id number] Arrival date & time: 01/09/18  1240     History   Chief Complaint Chief Complaint  Patient presents with  . Chest Pain    HPI Sara [name] is a 27 y.o. female who presents with chest pain. PMH significant for hx of DUB. She states that on Saturday she woke up with sharp and burning central chest pain. It is worse with deep breaths and lying down. It is better when she holds a pillow to her chest. It was her birthday over the weekend and she states that she partied and drank alcohol. After she started having the pain. She denies cocaine use. She tried Aleve with mild relief. She denies fever, cough, wheezing, abdominal pain, N/V, leg swelling. No recent surgery/travel/immobilization, hx of cancer, leg swelling, hemoptysis, prior DVT/PE, or hormone use. She also notes she's been having heavy vaginal bleeding for the past 3 months. This has been a problem for years. She has had a heavy period with clots for the past month. She has not followed up with OBGYN because she doesn't have insurance.  HPI  History reviewed. No pertinent past medical history.  There are no active problems to display for this patient.   History reviewed. No pertinent surgical history.   OB History   No obstetric history on file.      Home Medications    Prior to Admission medications   Medication Sig Start Date End Date Taking? Authorizing Provider  benzonatate (TESSALON) 100 MG capsule Take 1 capsule (100 mg total) by mouth every 8 (eight) hours. 06/11/16   Dorena BodoKennard, Lawrence, NP  ferrous sulfate 325 (65 FE) MG tablet Take 1 tablet (325 mg total) by mouth daily. 01/21/16   Ward, Chase PicketJaime Pilcher, PA-C    Family History History reviewed. No pertinent family history.  Social History Social History   Tobacco Use  . Smoking status: Current Every Day Smoker  . Smokeless tobacco: Never Used  Substance Use Topics    . Alcohol use: Yes  . Drug use: Not on file     Allergies   Patient has no known allergies.   Review of Systems Review of Systems  Constitutional: Negative for fever.  Respiratory: Positive for chest tightness and shortness of breath. Negative for cough and wheezing.   Cardiovascular: Positive for chest pain and palpitations. Negative for leg swelling.  Gastrointestinal: Negative for abdominal pain, nausea and vomiting.  Genitourinary: Positive for vaginal bleeding.     Physical Exam Updated Vital Signs BP (!) 141/92 (BP Location: Right Arm)   Pulse 98   Temp 98.8 F (37.1 C) (Oral)   Resp 16   LMP 01/09/2018 Comment: States she has had her period for the last 3 months  SpO2 100%   Physical Exam Vitals signs and nursing note reviewed.  Constitutional:      General: She is not in acute distress.    Appearance: She is well-developed. She is not ill-appearing.     Comments: Calm and cooperative. Appears to be in mild pain  HENT:     Head: Normocephalic and atraumatic.  Eyes:     General: No scleral icterus.       Right eye: No discharge.        Left eye: No discharge.     Conjunctiva/sclera: Conjunctivae normal.     Pupils: Pupils are equal, round, and reactive to light.  Neck:     Musculoskeletal: Normal range  of motion.  Cardiovascular:     Rate and Rhythm: Normal rate and regular rhythm.     Heart sounds: Normal heart sounds.  Pulmonary:     Effort: Pulmonary effort is normal. No respiratory distress.     Breath sounds: Normal breath sounds.  Chest:     Chest wall: No tenderness.  Abdominal:     General: Bowel sounds are normal. There is no distension.     Palpations: Abdomen is soft.     Tenderness: There is no abdominal tenderness.  Musculoskeletal:     Right lower leg: She exhibits no tenderness. No edema.     Left lower leg: She exhibits no tenderness. No edema.  Skin:    General: Skin is warm and dry.  Neurological:     Mental Status: She is alert  and oriented to person, place, and time.  Psychiatric:        Behavior: Behavior normal.      ED Treatments / Results  Labs (all labs ordered are listed, but only abnormal results are displayed) Labs Reviewed  BASIC METABOLIC PANEL  CBC  D-DIMER, QUANTITATIVE (NOT AT [facility])  I-STAT TROPONIN, ED  I-STAT BETA HCG BLOOD, ED ([facility], [facility], [facility] ONLY)    EKG None  Radiology Dg Chest 2 View  Result Date: 01/09/2018 CLINICAL DATA:  Chest pain EXAM: CHEST - 2 VIEW COMPARISON:  July 01, 2007 FINDINGS: Lungs are clear. Heart size and pulmonary vascularity are normal. No adenopathy. No pneumothorax. No bone lesions. IMPRESSION: No edema or consolidation. Electronically Signed   By: Bretta Bang III M.D.   On: 01/09/2018 13:24    Procedures Procedures (including critical care time)  Medications Ordered in ED Medications  alum & mag hydroxide-simeth (MAALOX/MYLANTA) 200-200-20 MG/5ML suspension 30 mL (30 mLs Oral Given 01/09/18 1327)  famotidine (PEPCID) tablet 40 mg (40 mg Oral Given 01/09/18 1327)     Initial Impression / Assessment and Plan / ED Course  I have reviewed the triage vital signs and the nursing notes.  Pertinent labs & imaging results that were available during my care of the patient were reviewed by me and considered in my medical decision making (see chart for details).  27 year old female presents with chest pain for the past 2 days after drinking on her birthday. DDx includes gastritis/GERD, PE, worsening anemia. She is initially hypertensive. This has improved on recheck. Other vitals are normal. On exam heart is regular rate and rhythm. Lungs are CTA. Abdomen is soft, non-tender. Pain is not reproducible. No clinical signs of DVT on exam. EKG is sinus tachycardia. CXR is normal. D-dimer is normal. Labs are normal. She was given Maalox and Pepcid and on recheck she feels better. She was given rx for antacid. She was encouraged to f/u with her OBGYN since this has been an  ongoing issue and her hgb is normal. She verbalized understanding.  Final Clinical Impressions(s) / ED Diagnoses   Final diagnoses:  Chest pain, unspecified type  Vaginal bleeding    ED Discharge Orders    None       Bethel Born, PA-C 01/09/18 1503    Donnetta Hutching, MD 01/10/18 [phone]

## 2018-01-09 NOTE — Discharge Instructions (Signed)
Take Pepcid as needed for heartburn/reflux for the next week Avoid NSAIDs (Ibuprofen or Aleve) until you are feeling better Follow up with [facility]  Return if worsening

## 2018-01-09 NOTE — ED Notes (Signed)
Pt stable, ambulatory, states understanding of discharge instructions 

## 2018-01-09 NOTE — ED Notes (Signed)
Pt on cell phone. Wont answer questions.

## 2018-01-09 NOTE — ED Triage Notes (Signed)
Pt c/o cental sharp intermittent chest pains since Saturday.

## 2018-01-28 ENCOUNTER — Encounter (HOSPITAL_COMMUNITY): Payer: Self-pay | Admitting: *Deleted

## 2018-01-28 ENCOUNTER — Inpatient Hospital Stay (HOSPITAL_COMMUNITY)
Admission: AD | Admit: 2018-01-28 | Discharge: 2018-01-28 | Disposition: A | Discharge status: Home / Self Care | Payer: Medicaid Other | Source: Ambulatory Visit | Attending: Obstetrics & Gynecology | Admitting: Obstetrics & Gynecology

## 2018-01-28 DIAGNOSIS — F17200 Nicotine dependence, unspecified, uncomplicated: Secondary | ICD-10-CM | POA: Diagnosis not present

## 2018-01-28 DIAGNOSIS — D500 Iron deficiency anemia secondary to blood loss (chronic): Secondary | ICD-10-CM

## 2018-01-28 DIAGNOSIS — N939 Abnormal uterine and vaginal bleeding, unspecified: Secondary | ICD-10-CM | POA: Diagnosis present

## 2018-01-28 HISTORY — DX: Anxiety disorder, unspecified: F41.9

## 2018-01-28 LAB — URINALYSIS, ROUTINE W REFLEX MICROSCOPIC

## 2018-01-28 LAB — CBC
HCT: 27.2 % — ABNORMAL LOW (ref 36.0–46.0)
Hemoglobin: 8.9 g/dL — ABNORMAL LOW (ref 12.0–15.0)
MCH: 28.8 pg (ref 26.0–34.0)
MCHC: 32.7 g/dL (ref 30.0–36.0)
MCV: 88 fL (ref 80.0–100.0)
Platelets: 367 10*3/uL (ref 150–400)
RBC: 3.09 MIL/uL — AB (ref 3.87–5.11)
RDW: 14.7 % (ref 11.5–15.5)
WBC: 7.8 10*3/uL (ref 4.0–10.5)
nRBC: 0 % (ref 0.0–0.2)

## 2018-01-28 LAB — WET PREP, GENITAL
Clue Cells Wet Prep HPF POC: NONE SEEN
Sperm: NONE SEEN
Trich, Wet Prep: NONE SEEN
Yeast Wet Prep HPF POC: NONE SEEN

## 2018-01-28 LAB — URINALYSIS, MICROSCOPIC (REFLEX)
BACTERIA UA: NONE SEEN
RBC / HPF: >50 RBC/hpf (ref 0–5)
SQUAMOUS EPITHELIAL / LPF: NONE SEEN (ref 0–5)

## 2018-01-28 LAB — POCT PREGNANCY, URINE: PREG TEST UR: NEGATIVE

## 2018-01-28 MED ORDER — POLYSACCHARIDE IRON COMPLEX 150 MG PO CAPS: 150.0000 mg | ORAL_CAPSULE | Freq: Every day | ORAL | 1 refills | Status: DC

## 2018-01-28 MED ORDER — MEGESTROL ACETATE 40 MG PO TABS: 40.0000 mg | ORAL_TABLET | Freq: Two times a day (BID) | ORAL | 0 refills | Status: DC

## 2018-01-28 NOTE — Progress Notes (Signed)
Donette Larry CNM in earlier to discuss test results and d./c plan. WRitten and verbal d/c instructions given and understanding voice.d

## 2018-01-28 NOTE — MAU Provider Note (Signed)
History     CSN: [id number]  Arrival date and time: 01/28/18 1455   First Provider Initiated Contact with Patient 01/28/18 2124      Chief Complaint  Patient presents with  . Vaginal Bleeding   28 y.o. female presenting with 2 month hx of VB. Reports daily bleeding. Uses many pads each day, unsure of quantity. Passing small clots at times, up to quarter sized. Occasionally has LAP. Endorses feeling lightheaded at times, no syncope. She is sexually active with same sex partner of 4 years.   Past Medical History:  Diagnosis Date  . Anxiety     Past Surgical History:  Procedure Laterality Date  . NO PAST SURGERIES      History reviewed. No pertinent family history.  Social History   Tobacco Use  . Smoking status: Current Every Day Smoker  . Smokeless tobacco: Never Used  Substance Use Topics  . Alcohol use: Yes  . Drug use: Never    Allergies: No Known Allergies  Medications Prior to Admission  Medication Sig Dispense Refill Last Dose  . famotidine (PEPCID) 20 MG tablet Take 1 tablet (20 mg total) by mouth 2 (two) times daily. 30 tablet 0 Past Month at Unknown time  . ferrous sulfate 325 (65 FE) MG tablet Take 1 tablet (325 mg total) by mouth daily. 30 tablet 0 01/28/2018 at Unknown time  . ibuprofen (ADVIL,MOTRIN) 200 MG tablet Take 800 mg by mouth every 6 (six) hours as needed for mild pain.   Past Month at Unknown time    Review of Systems  Constitutional: Negative for fever.  Gastrointestinal: Positive for abdominal pain.  Genitourinary: Positive for vaginal bleeding.  Neurological: Positive for weakness and light-headedness. Negative for syncope and headaches.   Physical Exam   Blood pressure 122/74, pulse 78, temperature 98 F (36.7 C), temperature source Oral, resp. rate 18, weight 77.1 kg, last menstrual period 01/09/2018, SpO2 100 %. Orthostatic VS for the past 24 hrs:  BP- Lying Pulse- Lying BP- Sitting Pulse- Sitting BP- Standing at 0 minutes Pulse-  Standing at 0 minutes  01/28/18 2146 115/67 76 120/78 78 129/74 86    Physical Exam  Nursing note and vitals reviewed. Constitutional: She is oriented to person, place, and time. She appears well-developed and well-nourished.  HENT:  Head: Normocephalic and atraumatic.  Neck: Normal range of motion.  Cardiovascular: Normal rate.  Respiratory: Effort normal. No respiratory distress.  GI: Soft. She exhibits no distension and no mass. There is no abdominal tenderness. There is no rebound and no guarding.  Genitourinary:    Genitourinary Comments: External: no lesions or erythema Vagina: rugated, pink, moist, small bloody discharge, cleared with 2 fox swabs Uterus: slightly enlarged, anteverted, non tender, no CMT Adnexae: no masses, no tenderness left, no tenderness right Cervix nml    Musculoskeletal: Normal range of motion.  Neurological: She is alert and oriented to person, place, and time.  Skin: Skin is warm and dry.  Psychiatric: She has a normal mood and affect.   Results for orders placed or performed during the hospital encounter of 01/28/18 (from the past 24 hour(s))  Urinalysis, Routine w reflex microscopic     Status: Abnormal   Collection Time: 01/28/18  4:38 PM  Result Value Ref Range   Color, Urine RED (A) YELLOW   APPearance TURBID (A) CLEAR   Specific Gravity, Urine  1.005 - 1.030    TEST NOT REPORTED DUE TO COLOR INTERFERENCE OF URINE PIGMENT   pH  5.0 - 8.0    TEST NOT REPORTED DUE TO COLOR INTERFERENCE OF URINE PIGMENT   Glucose, UA (A) NEGATIVE mg/dL    TEST NOT REPORTED DUE TO COLOR INTERFERENCE OF URINE PIGMENT   Hgb urine dipstick (A) NEGATIVE    TEST NOT REPORTED DUE TO COLOR INTERFERENCE OF URINE PIGMENT   Bilirubin Urine (A) NEGATIVE    TEST NOT REPORTED DUE TO COLOR INTERFERENCE OF URINE PIGMENT   Ketones, ur (A) NEGATIVE mg/dL    TEST NOT REPORTED DUE TO COLOR INTERFERENCE OF URINE PIGMENT   Protein, ur (A) NEGATIVE mg/dL    TEST NOT REPORTED DUE  TO COLOR INTERFERENCE OF URINE PIGMENT   Nitrite (A) NEGATIVE    TEST NOT REPORTED DUE TO COLOR INTERFERENCE OF URINE PIGMENT   Leukocytes, UA (A) NEGATIVE    TEST NOT REPORTED DUE TO COLOR INTERFERENCE OF URINE PIGMENT  Urinalysis, Microscopic (reflex)     Status: None   Collection Time: 01/28/18  4:38 PM  Result Value Ref Range   RBC / HPF >50 0 - 5 RBC/hpf   WBC, UA 0-5 0 - 5 WBC/hpf   Bacteria, UA NONE SEEN NONE SEEN   Squamous Epithelial / LPF NONE SEEN 0 - 5  Pregnancy, urine POC     Status: None   Collection Time: 01/28/18  4:42 PM  Result Value Ref Range   Preg Test, Ur NEGATIVE NEGATIVE  Wet prep, genital     Status: Abnormal   Collection Time: 01/28/18  9:35 PM  Result Value Ref Range   Yeast Wet Prep HPF POC NONE SEEN NONE SEEN   Trich, Wet Prep NONE SEEN NONE SEEN   Clue Cells Wet Prep HPF POC NONE SEEN NONE SEEN   WBC, Wet Prep HPF POC FEW (A) NONE SEEN   Sperm NONE SEEN   CBC     Status: Abnormal   Collection Time: 01/28/18  9:37 PM  Result Value Ref Range   WBC 7.8 4.0 - 10.5 K/uL   RBC 3.09 (L) 3.87 - 5.11 MIL/uL   Hemoglobin 8.9 (L) 12.0 - 15.0 g/dL   HCT 16.1 (L) 09.6 - 04.5 %   MCV 88.0 80.0 - 100.0 fL   MCH 28.8 26.0 - 34.0 pg   MCHC 32.7 30.0 - 36.0 g/dL   RDW 40.9 81.1 - 91.4 %   Platelets 367 150 - 400 K/uL   nRBC 0.0 0.0 - 0.2 %   MAU Course  Procedures  MDM Labs ordered and reviewed. Not orthostatic. Anemia noted. Needs further evaluation. Will order outpt pelvic US and start Megace and Fe. Stable for discharge home.   Assessment and Plan   1. Abnormal uterine bleeding (AUB)   2. Anemia, blood loss    Discharge home Follow up in [facility] after US Pelvic US 1 week Rx Megace Rx Ferrex  Allergies as of 01/28/2018   No Known Allergies     Medication List    STOP taking these medications   ferrous sulfate 325 (65 FE) MG tablet     TAKE these medications   famotidine 20 MG tablet Commonly known as:  PEPCID Take 1 tablet (20 mg total) by  mouth 2 (two) times daily.   ibuprofen 200 MG tablet Commonly known as:  ADVIL,MOTRIN Take 800 mg by mouth every 6 (six) hours as needed for mild pain.   iron polysaccharides 150 MG capsule Commonly known as:  NIFEREX Take 1 capsule (150 mg total) by mouth daily.  megestrol 40 MG tablet Commonly known as:  MEGACE Take 1 tablet (40 mg total) by mouth 2 (two) times daily.      Donette Larry, CNM 01/28/2018, 9:37 PM

## 2018-01-28 NOTE — Discharge Instructions (Signed)
Abnormal Uterine Bleeding Abnormal uterine bleeding means bleeding more than usual from your uterus. It can include:  Bleeding between periods.  Bleeding after sex.  Bleeding that is heavier than normal.  Periods that last longer than usual.  Bleeding after you have stopped having your period (menopause). There are many problems that may cause this. You should see a doctor for any kind of bleeding that is not normal. Treatment depends on the cause of the bleeding. Follow these instructions at home:  Watch your condition for any changes.  Do not use tampons, douche, or have sex, if your doctor tells you not to.  Change your pads often.  Get regular well-woman exams. Make sure they include a pelvic exam and cervical cancer screening.  Keep all follow-up visits as told by your doctor. This is important. Contact a doctor if:  The bleeding lasts more than one week.  You feel dizzy at times.  You feel like you are going to throw up (nauseous).  You throw up. Get help right away if:  You pass out.  You have to change pads every hour.  You have belly (abdominal) pain.  You have a fever.  You get sweaty.  You get weak.  You passing large blood clots from your vagina. Summary  Abnormal uterine bleeding means bleeding more than usual from your uterus.  There are many problems that may cause this. You should see a doctor for any kind of bleeding that is not normal.  Treatment depends on the cause of the bleeding. This information is not intended to replace advice given to you by your health care provider. Make sure you discuss any questions you have with your health care provider. Document Released: 11/08/2008 Document Revised: 01/06/2016 Document Reviewed: 01/06/2016 Elsevier Interactive Patient Education  2019 [employer]   Anemia  Anemia is a condition in which you do not have enough red blood cells or hemoglobin. Hemoglobin is a substance in red blood cells  that carries oxygen. When you do not have enough red blood cells or hemoglobin (are anemic), your body cannot get enough oxygen and your organs may not work properly. As a result, you may feel very tired or have other problems. What are the causes? Common causes of anemia include:  Excessive bleeding. Anemia can be caused by excessive bleeding inside or outside the body, including bleeding from the intestine or from periods in women.  Poor nutrition.  Long-lasting (chronic) kidney, thyroid, and liver disease.  Bone marrow disorders.  Cancer and treatments for cancer.  HIV (human immunodeficiency virus) and AIDS (acquired immunodeficiency syndrome).  Treatments for HIV and AIDS.  Spleen problems.  Blood disorders.  Infections, medicines, and autoimmune disorders that destroy red blood cells. What are the signs or symptoms? Symptoms of this condition include:  Minor weakness.  Dizziness.  Headache.  Feeling heartbeats that are irregular or faster than normal (palpitations).  Shortness of breath, especially with exercise.  Paleness.  Cold sensitivity.  Indigestion.  Nausea.  Difficulty sleeping.  Difficulty concentrating. Symptoms may occur suddenly or develop slowly. If your anemia is mild, you may not have symptoms. How is this diagnosed? This condition is diagnosed based on:  Blood tests.  Your medical history.  A physical exam.  Bone marrow biopsy. Your health care provider may also check your stool (feces) for blood and may do additional testing to look for the cause of your bleeding. You may also have other tests, including:  Imaging tests, such as a CT  scan or MRI.  Endoscopy.  Colonoscopy. How is this treated? Treatment for this condition depends on the cause. If you continue to lose a lot of blood, you may need to be treated at a hospital. Treatment may include:  Taking supplements of iron, vitamin Z79, or folic acid.  Taking a hormone  medicine (erythropoietin) that can help to stimulate red blood cell growth.  Having a blood transfusion. This may be needed if you lose a lot of blood.  Making changes to your diet.  Having surgery to remove your spleen. Follow these instructions at home:  Take over-the-counter and prescription medicines only as told by your health care provider.  Take supplements only as told by your health care provider.  Follow any diet instructions that you were given.  Keep all follow-up visits as told by your health care provider. This is important. Contact a health care provider if:  You develop new bleeding anywhere in the body. Get help right away if:  You are very weak.  You are short of breath.  You have pain in your abdomen or chest.  You are dizzy or feel faint.  You have trouble concentrating.  You have bloody or black, tarry stools.  You vomit repeatedly or you vomit up blood. Summary  Anemia is a condition in which you do not have enough red blood cells or enough of a substance in your red blood cells that carries oxygen (hemoglobin).  Symptoms may occur suddenly or develop slowly.  If your anemia is mild, you may not have symptoms.  This condition is diagnosed with blood tests as well as a medical history and physical exam. Other tests may be needed.  Treatment for this condition depends on the cause of the anemia. This information is not intended to replace advice given to you by your health care provider. Make sure you discuss any questions you have with your health care provider. Document Released: 02/19/2004 Document Revised: 02/13/2016 Document Reviewed: 02/13/2016 Elsevier Interactive Patient Education  2019 [employer].

## 2018-01-28 NOTE — MAU Note (Signed)
Sara [name] is a 28 y.o.  here in MAU reporting: +vaginal bleeding with clots. Constant. +weakness. States wearing "tissues" and pads. Onset of complaint: past 4 months Pain score: denies Lab orders placed from triage: ua and pregnancy test

## 2018-01-30 LAB — GC/CHLAMYDIA PROBE AMP (~~LOC~~) NOT AT ARMC
CHLAMYDIA, DNA PROBE: NEGATIVE
Neisseria Gonorrhea: NEGATIVE

## 2018-02-02 ENCOUNTER — Telehealth: Payer: Self-pay | Admitting: Obstetrics and Gynecology

## 2018-02-02 NOTE — Telephone Encounter (Signed)
I talked to the patient today. She stated she would rather have her appointments on Monday and she is currently at work. She asked that I schedule the appt and she would call back with the updated time and date. I've scheduled the appt and also sending a letter reminder to the patient.

## 2018-02-13 ENCOUNTER — Ambulatory Visit (HOSPITAL_COMMUNITY)
Admission: RE | Admit: 2018-02-13 | Discharge: 2018-02-13 | Disposition: A | Discharge status: Home / Self Care | Payer: Medicaid Other | Source: Ambulatory Visit | Attending: Certified Nurse Midwife | Admitting: Certified Nurse Midwife

## 2018-02-13 DIAGNOSIS — N939 Abnormal uterine and vaginal bleeding, unspecified: Secondary | ICD-10-CM | POA: Insufficient documentation

## 2018-02-13 DIAGNOSIS — D500 Iron deficiency anemia secondary to blood loss (chronic): Secondary | ICD-10-CM | POA: Diagnosis present

## 2018-02-15 ENCOUNTER — Ambulatory Visit: Payer: Medicaid Other | Admitting: Obstetrics & Gynecology

## 2018-02-27 ENCOUNTER — Encounter: Payer: Self-pay | Admitting: Obstetrics & Gynecology

## 2018-02-27 ENCOUNTER — Ambulatory Visit (INDEPENDENT_AMBULATORY_CARE_PROVIDER_SITE_OTHER): Payer: Self-pay | Admitting: Obstetrics & Gynecology

## 2018-02-27 VITALS — BP 136/87 | HR 96 | Wt 171.1 lb

## 2018-02-27 DIAGNOSIS — D500 Iron deficiency anemia secondary to blood loss (chronic): Secondary | ICD-10-CM

## 2018-02-27 DIAGNOSIS — N939 Abnormal uterine and vaginal bleeding, unspecified: Secondary | ICD-10-CM

## 2018-02-27 MED ORDER — NAPROXEN 500 MG PO TABS: 500.0000 mg | ORAL_TABLET | Freq: Two times a day (BID) | ORAL | 2 refills | Status: DC

## 2018-02-27 MED ORDER — FERROUS SULFATE 325 (65 FE) MG PO TABS: 325.0000 mg | ORAL_TABLET | Freq: Two times a day (BID) | ORAL | 1 refills | Status: DC

## 2018-02-27 MED ORDER — MEGESTROL ACETATE 40 MG PO TABS: 40.0000 mg | ORAL_TABLET | Freq: Two times a day (BID) | ORAL | 4 refills | Status: DC

## 2018-02-27 NOTE — Patient Instructions (Addendum)
Menorrhagia/Heavy bleeding  Menorrhagia is a condition in which menstrual periods are heavy or last longer than normal. With menorrhagia, most periods a woman has may cause enough blood loss and cramping that she becomes unable to take part in her usual activities. What are the causes? Common causes of this condition include:  Noncancerous growths in the uterus (polyps or fibroids).  An imbalance of the estrogen and progesterone hormones.  One of the ovaries not releasing an egg during one or more months.  A problem with the thyroid gland (hypothyroid).  Side effects of having an intrauterine device (IUD).  Side effects of some medicines, such as blood thinners.  A bleeding disorder that stops the blood from clotting normally. In some cases, the cause of this condition is not known. What are the signs or symptoms? Symptoms of this condition include:  Routinely having to change your pad or tampon every 1-2 hours because it is completely soaked.  Needing to use pads and tampons at the same time because of heavy bleeding.  Needing to wake up to change your pads or tampons during the night.  Passing blood clots larger than 1 inch (2.5 cm) in size.  Having bleeding that lasts for more than 7 days.  Having symptoms of low iron levels (anemia), such as tiredness, fatigue, or shortness of breath. How is this diagnosed? This condition may be diagnosed based on:  A physical exam.  Your symptoms and menstrual history.  Tests, such as: ? Blood tests to check if you are pregnant or have hormonal changes, a bleeding or thyroid disorder, anemia, or other problems. ? Pap test to check for cancerous changes, infections, or inflammation. ? Endometrial biopsy. This test involves removing a tissue sample from the lining of the uterus (endometrium) to be examined under a microscope. ? Pelvic ultrasound. This test uses sound waves to create images of your uterus, ovaries, and vagina. The  images can show if you have fibroids or other growths. ? Hysteroscopy. For this test, a small telescope is used to look inside your uterus. How is this treated? Treatment may not be needed for this condition. If it is needed, the best treatment for you will depend on:  Whether you need to prevent pregnancy.  Your desire to have children in the future.  The cause and severity of your bleeding.  Your personal preference. Medicines are the first step in treatment. You may be treated with:  Hormonal  methods. These treatments reduce bleeding during your menstrual period. They include: ? Birth control pills. ? Progesterone only pills. ? Skin patch. ? Vaginal ring. ? Shots (injections) that you get every 3 months. ? Hormonal IUD (intrauterine device). ? Implants that go under the skin.  Medicines that thicken blood and slow bleeding.  Medicines that reduce swelling, such as ibuprofen or naproxen  Medicines that contain an artificial (synthetic) hormone called progestin.  Medicines that make the ovaries stop working for a short time.  Iron supplements to treat anemia. If medicines do not work, surgery may be done. Surgical options may include:  Dilation and curettage (D&C). In this procedure, your health care provider opens (dilates) your cervix and then scrapes or suctions tissue from the endometrium to reduce menstrual bleeding.  Operative hysteroscopy. In this procedure, a small tube with a light on the end (hysteroscope) is used to view your uterus and help remove polyps that may be causing heavy periods.  Endometrial ablation. This is when various techniques are used to permanently destroy  your entire endometrium. After endometrial ablation, most women have little or no menstrual flow. This procedure reduces your ability to become pregnant.  Endometrial resection. In this procedure, an electrosurgical wire loop is used to remove the endometrium. This procedure reduces your  ability to become pregnant.  Hysterectomy. This is surgical removal of the uterus. This is a permanent procedure that stops menstrual periods. Pregnancy is not possible after a hysterectomy. Follow these instructions at home: Medicines  Take over-the-counter and prescription medicines exactly as told by your health care provider. This includes iron pills.  Do not change or switch medicines without asking your health care provider.  Do not take aspirin or medicines that contain aspirin 1 week before or during your menstrual period. Aspirin may make bleeding worse. General instructions  If you need to change your sanitary pad or tampon more than once every 2 hours, limit your activity until the bleeding stops.  Iron pills can cause constipation. To prevent or treat constipation while you are taking prescription iron supplements, your health care provider may recommend that you: ? Drink enough fluid to keep your urine clear or pale yellow. ? Take over-the-counter or prescription medicines. ? Eat foods that are high in fiber, such as fresh fruits and vegetables, whole grains, and beans. ? Limit foods that are high in fat and processed sugars, such as fried and sweet foods.  Eat well-balanced meals, including foods that are high in iron. Foods that have a lot of iron include leafy green vegetables, meat, liver, eggs, and whole grain breads and cereals.  Do not try to lose weight until the abnormal bleeding has stopped and your blood iron level is back to normal. If you need to lose weight, work with your health care provider to lose weight safely.  Keep all follow-up visits as told by your health care provider. This is important. Contact a health care provider if:  You soak through a pad or tampon every 1 or 2 hours, and this happens every time you have a period.  You need to use pads and tampons at the same time because you are bleeding so much.  You have nausea, vomiting, diarrhea, or  other problems related to medicines you are taking. Get help right away if:  You soak through more than a pad or tampon in 1 hour.  You pass clots bigger than 1 inch (2.5 cm) wide.  You feel short of breath.  You feel like your heart is beating too fast.  You feel dizzy or faint.  You feel very weak or tired. Summary  Menorrhagia is a condition in which menstrual periods are heavy or last longer than normal.  Treatment will depend on the cause of the condition and may include medicines or procedures.  Take over-the-counter and prescription medicines exactly as told by your health care provider. This includes iron pills.  Get help right away if you have heavy bleeding that soaks through more than a pad or tampon in 1 hour, you are passing large clots, or you feel dizzy, faint or short of breath. This information is not intended to replace advice given to you by your health care provider. Make sure you discuss any questions you have with your health care provider. Document Released: 01/11/2005 Document Revised: 01/05/2016 Document Reviewed: 01/05/2016 Elsevier Interactive Patient Education  2019 [employer].   Thank you for enrolling in [facility]. Please follow the instructions below to securely access your online medical record. MyChart allows you to send messages  to your doctor, view your test results, manage appointments, and more.   How Do I Sign Up? 1. In your Internet browser, go to [employer] Address [employer] and enter [email or web address]. 2. Click on the Sign Up Now link in the Sign In box. You will see the New Member Sign Up page. 3. Enter your MyChart Access Code exactly as it appears below. You will not need to use this code after you've completed the sign-up process. If you do not sign up before the expiration date, you must request a new code.  MyChart Access Code: [email or web address] Expires: 04/13/2018  2:46 PM  4. Enter your Social Security Number ([id number]) and  Date of Birth (mm/dd/yyyy) as indicated and click Submit. You will be taken to the next sign-up page. 5. Create a MyChart ID. This will be your MyChart login ID and cannot be changed, so think of one that is secure and easy to remember. 6. Create a MyChart password. You can change your password at any time. 7. Enter your Password Reset Question and Answer. This can be used at a later time if you forget your password.  8. Enter your e-mail address. You will receive e-mail notification when new information is available in MyChart. 9. Click Sign Up. You can now view your medical record.   Additional Information Remember, MyChart is NOT to be used for urgent needs. For medical emergencies, dial 911.

## 2018-02-27 NOTE — Progress Notes (Signed)
GYNECOLOGY OFFICE VISIT NOTE  History:  28 y.o. G1P1001 here today for management of heavy menstrual bleeding.  Was seen and evaluated for this in the [facility] on 01/28/2018 and reported 4 months of erratic and heavy AUB; she had hemoglobin of 8.9, negative infection screening.  She was prescribed Megace, but she did not take it because AUB stopped. It restarted in the last week and is heavy; associated with cramping. No presyncopal symptoms. She denies any abnormal vaginal discharge, pelvic pain or other concerns.   Past Medical History:  Diagnosis Date  . Anxiety     Past Surgical History:  Procedure Laterality Date  . NO PAST SURGERIES      The following portions of the patient's history were reviewed and updated as appropriate: allergies, current medications, past family history, past medical history, past social history, past surgical history and problem list.   Health Maintenance:  Normal pap about 7 years ago.  Review of Systems:  Pertinent items noted in HPI and remainder of comprehensive ROS otherwise negative.  Objective:  Physical Exam BP 136/87   Pulse 96   Wt 171 lb 1.6 oz (77.6 kg)   BMI 32.33 kg/m  CONSTITUTIONAL: Well-developed, well-nourished female in no acute distress.  HEENT:  Normocephalic, atraumatic. External right and left ear normal. No scleral icterus.  NECK: Normal range of motion, supple, no masses noted on observation SKIN: No rash noted. Not diaphoretic. No erythema. No pallor. MUSCULOSKELETAL: Normal range of motion. No edema noted. NEUROLOGIC: Alert and oriented to person, place, and time. Normal muscle tone coordination. No cranial nerve deficit noted. PSYCHIATRIC: Normal mood and affect. Normal behavior. Normal judgment and thought content. CARDIOVASCULAR: Normal heart rate noted RESPIRATORY: Effort and breath sounds normal, no problems with respiration noted ABDOMEN: No masses noted. No other overt distention noted.   PELVIC: Deferred (no  concerning findings during provider exam in [facility])  Labs and Imaging CBC Latest Ref Rng & Units 01/28/2018 01/09/2018 01/20/2016  WBC 4.0 - 10.5 K/uL 7.8 7.3 10.0  Hemoglobin 12.0 - 15.0 g/dL 7.8(E) 42.3 5.3(I)  Hematocrit 36.0 - 46.0 % 27.2(L) 41.1 28.9(L)  Platelets 150 - 400 K/uL 367 330 477(H)    US Pelvic Complete With Transvaginal  Result Date: 02/13/2018 CLINICAL DATA:  Abnormal uterine bleeding. EXAM: TRANSABDOMINAL AND TRANSVAGINAL ULTRASOUND OF PELVIS TECHNIQUE: Both transabdominal and transvaginal ultrasound examinations of the pelvis were performed. Transabdominal technique was performed for global imaging of the pelvis including uterus, ovaries, adnexal regions, and pelvic cul-de-sac. It was necessary to proceed with endovaginal exam following the transabdominal exam to visualize the endometrium. COMPARISON:  None FINDINGS: Uterus Measurements: 8.5 x 4.8 x 6.0 cm = volume: 128.0 mL. No fibroids or other mass visualized. Endometrium Thickness: 24 mm.  No focal abnormality visualized. Right ovary Measurements: 4.3 x 2.1 x 2.6 cm = volume: 12.2 mL. Normal appearance/no adnexal mass. Left ovary Measurements: 3.9 x 1.9 x 3.1 cm = volume: 11.9 mL. Normal appearance/no adnexal mass. Other findings No abnormal free fluid. IMPRESSION: Abnormal thickened vascular endometrium. If bleeding remains unresponsive to hormonal or medical therapy, focal lesion work-up with sonohysterogram should be considered. Endometrial biopsy should also be considered in pre-menopausal patients at high risk for endometrial carcinoma. (Ref: Radiological Reasoning: Algorithmic Workup of Abnormal Vaginal Bleeding with Endovaginal Sonography and Sonohysterography. AJR 2008; 144:R15-40) Electronically Signed   By: Annia Belt M.D.   On: 02/13/2018 10:28    Assessment & Plan:  1. Abnormal uterine bleeding (AUB) Discussed management options  for abnormal uterine bleeding including NSAIDs (Naproxen), oral progesterone, Depo  Provera, Levonogestrel IUD, endometrial ablation or hysterectomy as definitive surgical management. Did not offer combined OCPs or Lysteda as patient is a heavy smoker and thus has increased DVT/PE risk; discussed this with patient.    Discussed risks and benefits of each method.   Patient desires oral progesterone and NSAID therapy for now.  Printed patient education handouts were given to the patient to review at home.  Megace and Naproxen prescribed for now,  bleeding precautions reviewed.  Will recheck CBC and also check TSH today.  - CBC - TSH - megestrol (MEGACE) 40 MG tablet; Take 1 tablet (40 mg total) by mouth 2 (two) times daily. Can increase to 2 tablets twice a day for heavy bleeding.  Dispense: 60 tablet; Refill: 4 - naproxen (NAPROSYN) 500 MG tablet; Take 1 tablet (500 mg total) by mouth 2 (two) times daily with a meal.  Dispense: 60 tablet; Refill: 2  2. Anemia due to blood loss Oral iron therapy ordered for anemia, will continue to monitor - ferrous sulfate (FERROUSUL) 325 (65 FE) MG tablet; Take 1 tablet (325 mg total) by mouth 2 (two) times daily.  Dispense: 60 tablet; Refill: 1  Routine preventative health maintenance measures emphasized.  Return in about 1 month (around 03/28/2018) for Annual exam, pap smear and follow up AUB.   Total face-to-face time with patient: 25 minutes.  Over 50% of encounter was spent on counseling and coordination of care.   Jaynie Collins, MD, FACOG Obstetrician & Gynecologist, [facility] for [employer], [facility]

## 2018-02-27 NOTE — Progress Notes (Deleted)
GYNECOLOGY OFFICE VISIT NOTE  History:  28 y.o. G1P1001 here today for ***. She denies any abnormal vaginal discharge, bleeding, pelvic pain or other concerns.   Past Medical History:  Diagnosis Date  . Anxiety     Past Surgical History:  Procedure Laterality Date  . NO PAST SURGERIES      The following portions of the patient's history were reviewed and updated as appropriate: allergies, current medications, past family history, past medical history, past social history, past surgical history and problem list.   Health Maintenance:  Normal pap and negative HRHPV on ***.  Normal mammogram on ***.   Review of Systems:  Pertinent items noted in HPI and remainder of comprehensive ROS otherwise negative.  Objective:  Physical Exam BP 136/87   Pulse 96   Wt 171 lb 1.6 oz (77.6 kg)   BMI 32.33 kg/m  CONSTITUTIONAL: Well-developed, well-nourished female in no acute distress.  HEENT:  Normocephalic, atraumatic. External right and left ear normal. No scleral icterus.  NECK: Normal range of motion, supple, no masses noted on observation SKIN: No rash noted. Not diaphoretic. No erythema. No pallor. MUSCULOSKELETAL: Normal range of motion. No edema noted. NEUROLOGIC: Alert and oriented to person, place, and time. Normal muscle tone coordination. No cranial nerve deficit noted. PSYCHIATRIC: Normal mood and affect. Normal behavior. Normal judgment and thought content. CARDIOVASCULAR: Normal heart rate noted RESPIRATORY: Effort and breath sounds normal, no problems with respiration noted ABDOMEN: No masses noted. No other overt distention noted.   PELVIC: {Blank single:19197::"Deferred","Normal appearing external genitalia; normal appearing vaginal mucosa and cervix.  No abnormal discharge noted.  Normal uterine size, no other palpable masses, no uterine or adnexal tenderness."}  Labs and Imaging No results found for this or any previous visit (from the past 168 hour(s)). US Pelvic  Complete With Transvaginal  Result Date: 02/13/2018 CLINICAL DATA:  Abnormal uterine bleeding. EXAM: TRANSABDOMINAL AND TRANSVAGINAL ULTRASOUND OF PELVIS TECHNIQUE: Both transabdominal and transvaginal ultrasound examinations of the pelvis were performed. Transabdominal technique was performed for global imaging of the pelvis including uterus, ovaries, adnexal regions, and pelvic cul-de-sac. It was necessary to proceed with endovaginal exam following the transabdominal exam to visualize the endometrium. COMPARISON:  None FINDINGS: Uterus Measurements: 8.5 x 4.8 x 6.0 cm = volume: 128.0 mL. No fibroids or other mass visualized. Endometrium Thickness: 24 mm.  No focal abnormality visualized. Right ovary Measurements: 4.3 x 2.1 x 2.6 cm = volume: 12.2 mL. Normal appearance/no adnexal mass. Left ovary Measurements: 3.9 x 1.9 x 3.1 cm = volume: 11.9 mL. Normal appearance/no adnexal mass. Other findings No abnormal free fluid. IMPRESSION: Abnormal thickened vascular endometrium. If bleeding remains unresponsive to hormonal or medical therapy, focal lesion work-up with sonohysterogram should be considered. Endometrial biopsy should also be considered in pre-menopausal patients at high risk for endometrial carcinoma. (Ref: Radiological Reasoning: Algorithmic Workup of Abnormal Vaginal Bleeding with Endovaginal Sonography and Sonohysterography. AJR 2008; 924:M62-86) Electronically Signed   By: Annia Belt M.D.   On: 02/13/2018 10:28    Assessment & Plan:  1. Abnormal uterine bleeding (AUB) *** - CBC - TSH - megestrol (MEGACE) 40 MG tablet; Take 1 tablet (40 mg total) by mouth 2 (two) times daily. Can increase to 2 tablets twice a day for heavy bleeding.  Dispense: 60 tablet; Refill: 4   Routine preventative health maintenance measures emphasized. Please refer to After Visit Summary for other counseling recommendations.   Return in about 1 month (around 03/28/2018) for Annual exam, pap  smear and follow up  AUB.   Total face-to-face time with patient: {Blank single:19197::"10","15","20","25","30"} minutes.  Over 50% of encounter was spent on counseling and coordination of care.   Jaynie CollinsUGONNA  , MD, FACOG Obstetrician & Gynecologist, [facility], [facility]

## 2018-02-28 LAB — TSH: TSH: 2.12 u[IU]/mL (ref 0.450–4.500)

## 2018-02-28 LAB — CBC
HEMOGLOBIN: 12.2 g/dL (ref 11.1–15.9)
Hematocrit: 37.2 % (ref 34.0–46.6)
MCH: 29.4 pg (ref 26.6–33.0)
MCHC: 32.8 g/dL (ref 31.5–35.7)
MCV: 90 fL (ref 79–97)
Platelets: 380 10*3/uL (ref 150–450)
RBC: 4.15 x10E6/uL (ref 3.77–5.28)
RDW: 13.5 % (ref 11.7–15.4)
WBC: 9.9 10*3/uL (ref 3.4–10.8)

## 2018-04-03 ENCOUNTER — Ambulatory Visit: Payer: Medicaid Other | Admitting: Obstetrics & Gynecology

## 2018-04-03 NOTE — Progress Notes (Deleted)
   Patient did not show up today for her scheduled appointment.   Marcus Schwandt, MD, FACOG Obstetrician & Gynecologist, [facility], [facility]  

## 2018-05-29 IMAGING — US US PELVIS COMPLETE
1 series · 13 of 25 positions shown · non-contrast
Comparison: None.

CLINICAL DATA: Vaginal bleeding x1 month with pain.

EXAM:
TRANSABDOMINAL AND TRANSVAGINAL ULTRASOUND OF PELVIS
DOPPLER ULTRASOUND OF OVARIES
TECHNIQUE: Both transabdominal and transvaginal ultrasound examinations of the
pelvis were performed. Transabdominal technique was performed for
global imaging of the pelvis including uterus, ovaries, adnexal
regions, and pelvic cul-de-sac.
It was necessary to proceed with endovaginal exam following the
transabdominal exam to visualize the endometrium and ovaries. Color
and duplex Doppler ultrasound was utilized to evaluate blood flow to
the ovaries.

[Series 1: us pelvis complete · 0.21mm/px · 13 of 67 slices shown]
[im 1/67]
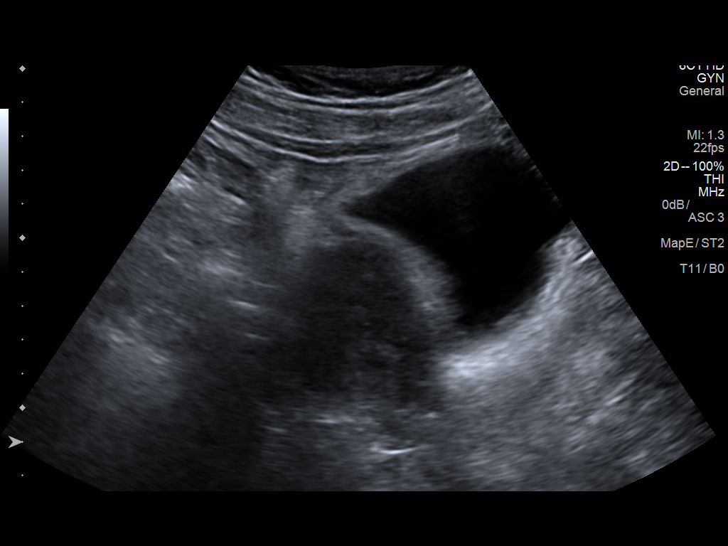
[im 6/67]
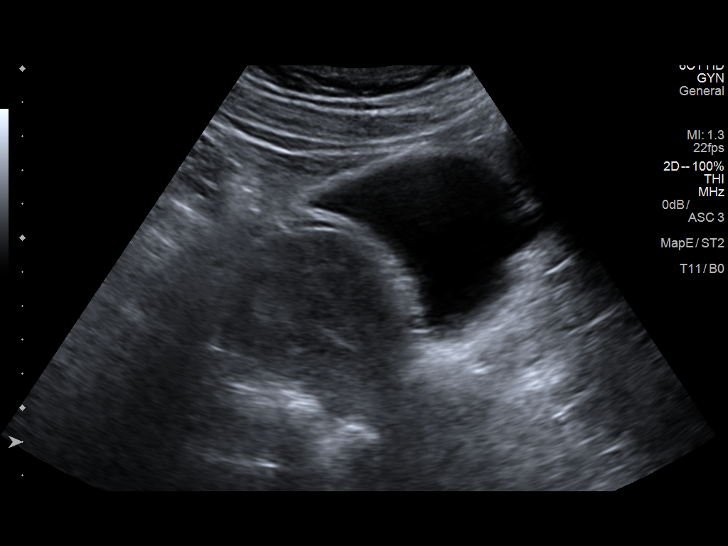
[im 12/67]
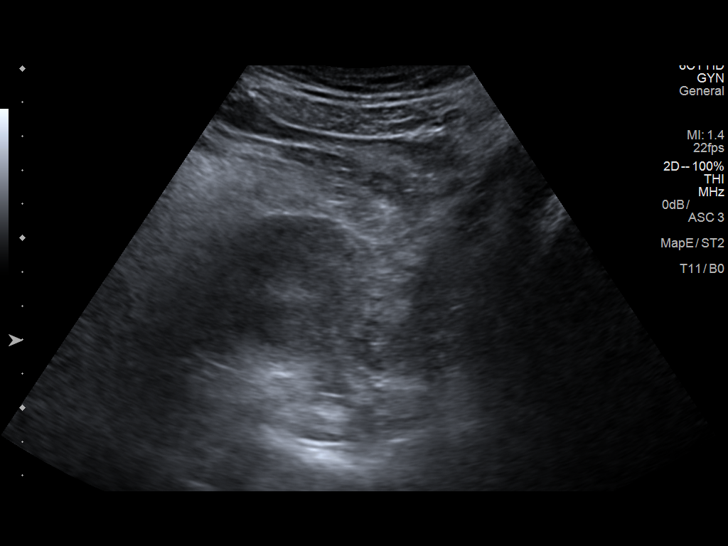
[im 17/67]
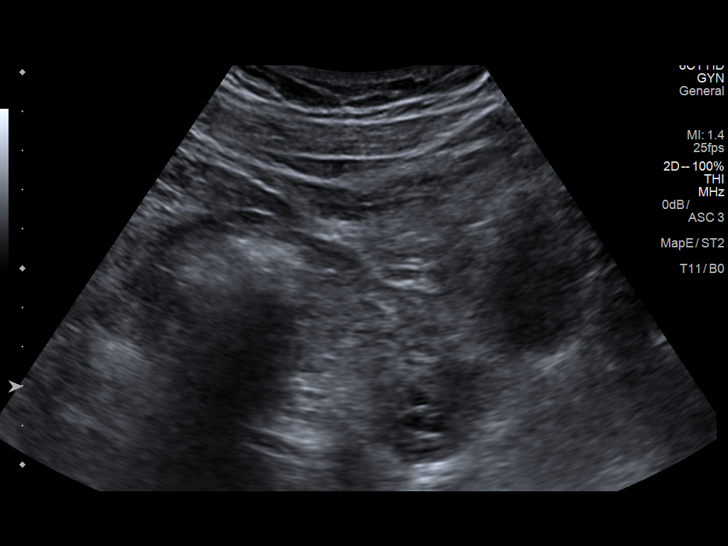
[im 23/67]
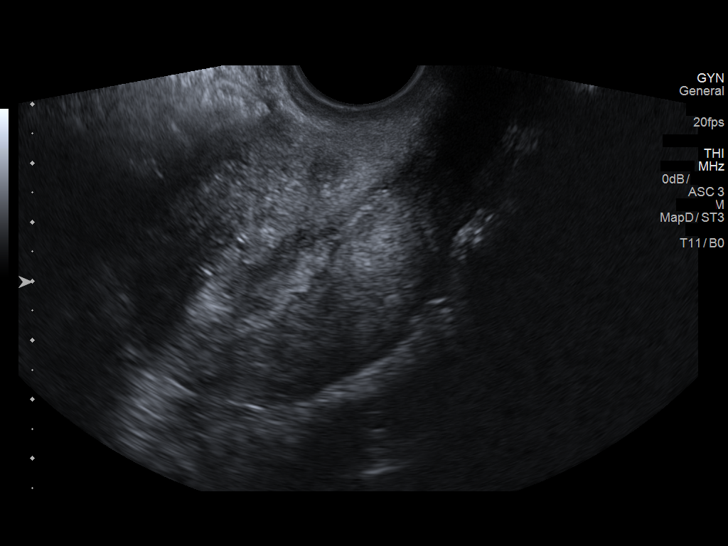
[im 28/67]
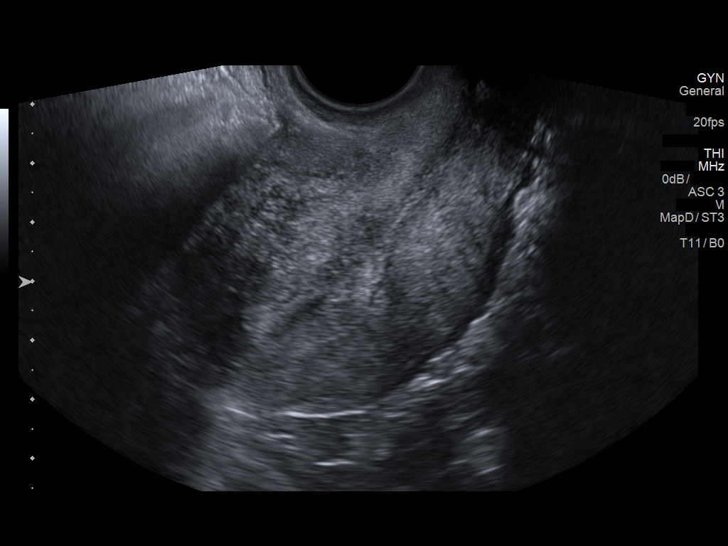
[im 34/67]
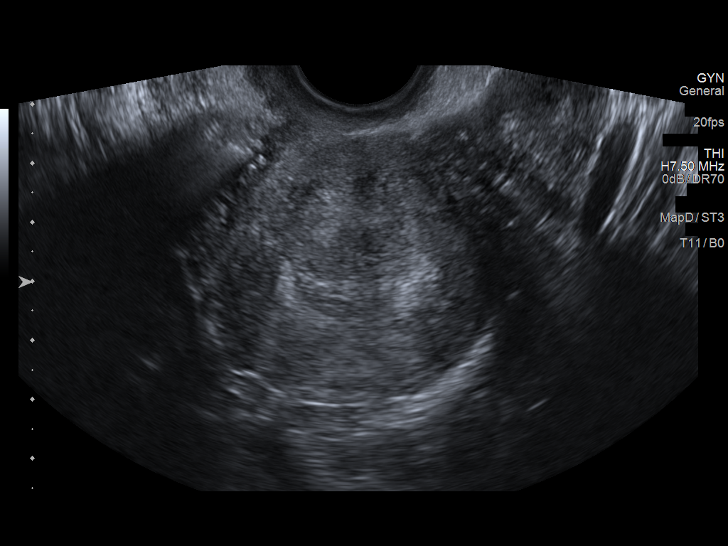
[im 39/67]
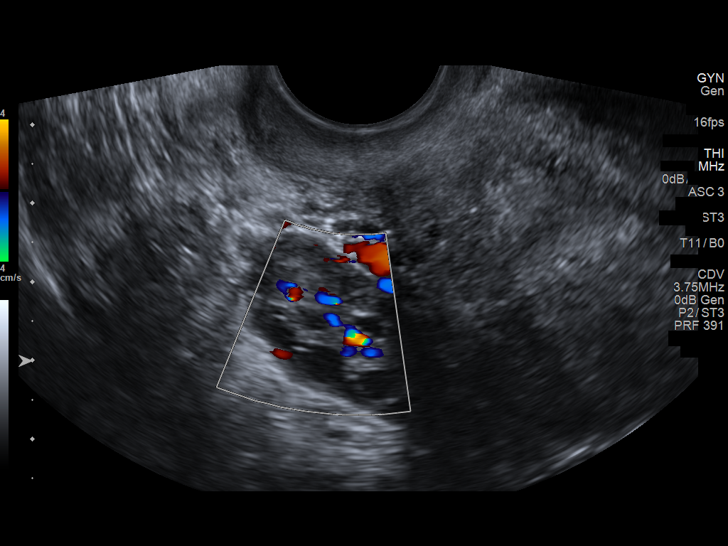
[im 45/67]
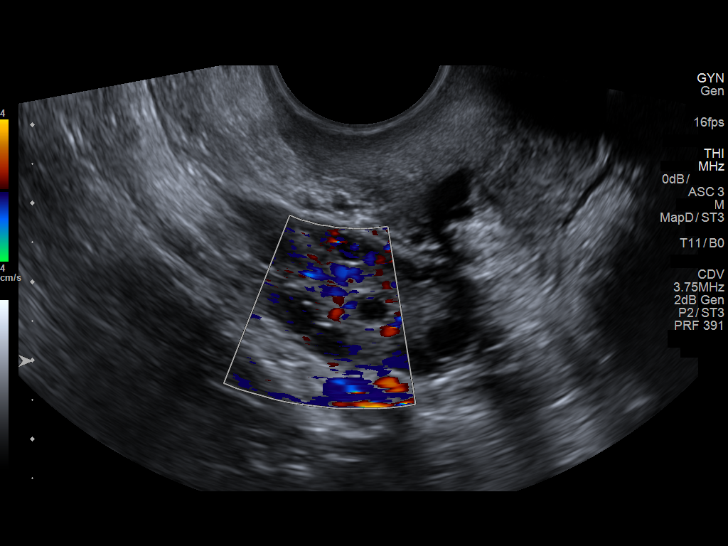
[im 50/67]
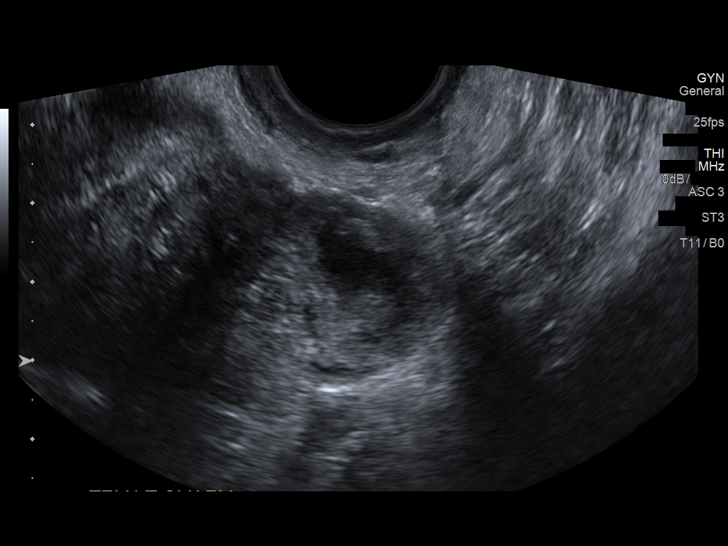
[im 56/67]
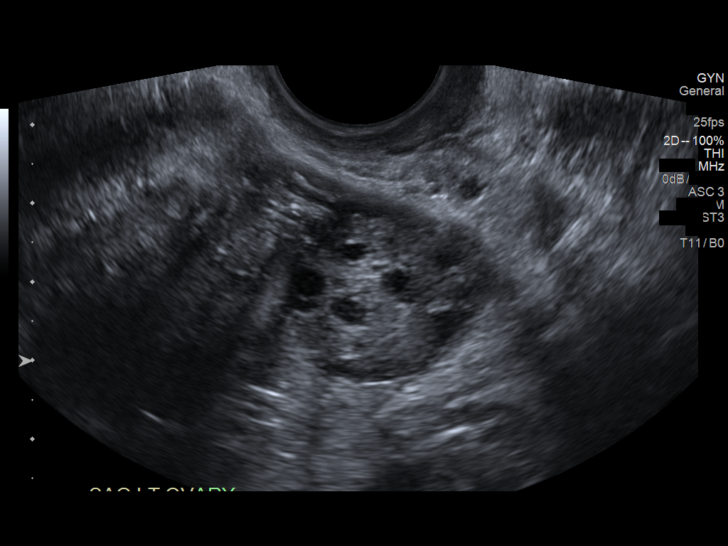
[im 61/67]
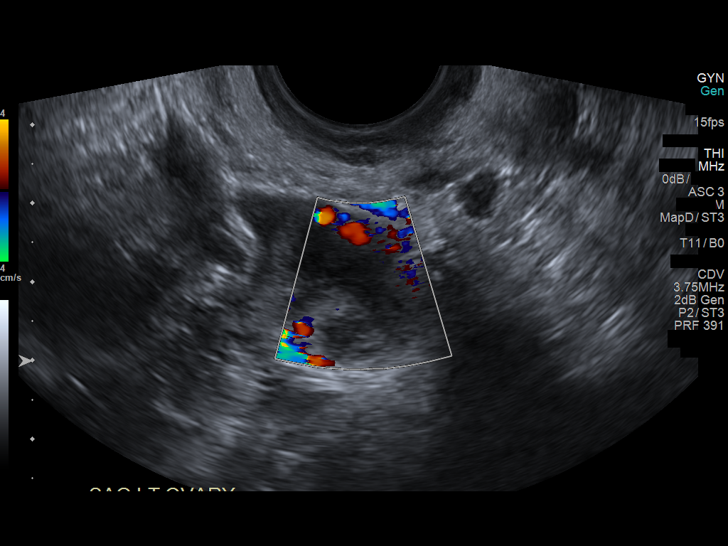
[im 67/67]
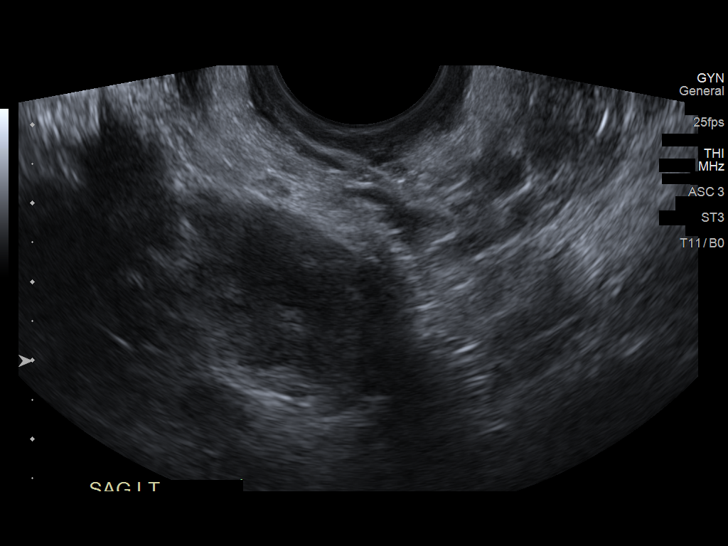

[13 of 25 positions shown; findings below may reference images not displayed]

FINDINGS: Uterus

Measurements: 7.8 x 4.5 x 4.6 cm. The uterus is slightly anteverted.
No fibroids or other mass visualized.

Endometrium

Thickness: Slightly heterogeneous and irregular in appearance with
measuring up to 13 mm. Interposed hypoechoic fluid and/or products
of menstruation noted. No focal abnormality visualized.

Right ovary

Measurements: 3.2 x 1.7 x 3 cm. Normal appearance/no adnexal mass.

Left ovary

Measurements: 3 x 2.6 x 3 cm. There appears be an involuting cyst or
follicle in the left ovary measuring approximately 1.6 x 2.1 x
cm.

Pulsed Doppler evaluation of both ovaries demonstrates normal
low-resistance arterial and venous waveforms.

Other findings

No abnormal free fluid.
IMPRESSION: Irregular slightly thickened appearance of the endometrium,
nonspecific in etiology. Patient reports having stopped menses on
the day of this exam. The irregular hypoechoic appearance within the
endometrial cavity may represent some residual products of
menstruation and/or fluid.

Involuting follicle or cyst of the left ovary.

No torsion of either ovary.

## 2019-08-14 ENCOUNTER — Encounter (HOSPITAL_COMMUNITY): Payer: Self-pay

## 2019-08-14 ENCOUNTER — Other Ambulatory Visit: Payer: Self-pay

## 2019-08-14 ENCOUNTER — Ambulatory Visit (HOSPITAL_COMMUNITY)
Admission: EM | Admit: 2019-08-14 | Discharge: 2019-08-14 | Disposition: A | Discharge status: Home / Self Care | Payer: Medicaid Other | Attending: Physician Assistant | Admitting: Physician Assistant

## 2019-08-14 DIAGNOSIS — M7052 Other bursitis of knee, left knee: Secondary | ICD-10-CM | POA: Diagnosis not present

## 2019-08-14 HISTORY — DX: Hidradenitis suppurativa: L73.2

## 2019-08-14 MED ORDER — PREDNISONE 10 MG PO TABS: 40.0000 mg | ORAL_TABLET | Freq: Every day | ORAL | 0 refills | Status: AC

## 2019-08-14 MED ORDER — DICLOFENAC SODIUM 1 % EX GEL: 4.0000 g | Freq: Four times a day (QID) | CUTANEOUS | 0 refills | Status: DC

## 2019-08-14 MED ORDER — ACETAMINOPHEN 500 MG PO TABS: 1000.0000 mg | ORAL_TABLET | Freq: Three times a day (TID) | ORAL | 0 refills | Status: DC | PRN

## 2019-08-14 NOTE — ED Triage Notes (Signed)
Pt c/o 8/10 sharp throbbing pain in left knee. Pt denies injury, but believes the pain may be due to her kicking the covers off in the middle of the night. Pt states she can't straighten her leg all of the way. Pt limped to triage. Pt has traced swelling of left knee.

## 2019-08-14 NOTE — ED Provider Notes (Signed)
[facility]    CSN: [id number] Arrival date & time: 08/14/19  1111      History   Chief Complaint Chief Complaint  Patient presents with  . Knee Pain    HPI Sara [name] is a 29 y.o. female.   Patient presents for left knee pain.  She reports this started yesterday morning.  She describes a sharp pain the bottom part of her left knee.  She reports this is been persistent since yesterday morning.  She reports pain is worse with full extension of the knee and with walking.  She denies fever or chills.  Denies pain anywhere else in the knee.  Has not noticed much swelling.  She reports she had something similar a few months ago that improved with rest and aspirin.  She has not had any injuries or wounds to the knee.  There is been no lower leg swelling.  She reports she has only sexually active with a female partner and she cannot be pregnant right now.     Past Medical History:  Diagnosis Date  . Anxiety   . Hidradenitis suppurativa     There are no problems to display for this patient.   Past Surgical History:  Procedure Laterality Date  . NO PAST SURGERIES      OB History    Gravida  1   Para  1   Term  1   Preterm      AB      Living  1     SAB      TAB      Ectopic      Multiple      Live Births  1            Home Medications    Prior to Admission medications   Medication Sig Start Date End Date Taking? Authorizing Provider  acetaminophen (TYLENOL) 500 MG tablet Take 2 tablets (1,000 mg total) by mouth every 8 (eight) hours as needed. 08/14/19   Miracle Criado, Veryl Speak, PA-C  diclofenac Sodium (VOLTAREN) 1 % GEL Apply 4 g topically 4 (four) times daily. 08/14/19   Perpetua Elling, Veryl Speak, PA-C  famotidine (PEPCID) 20 MG tablet Take 1 tablet (20 mg total) by mouth 2 (two) times daily. 01/09/18   Bethel Born, PA-C  ferrous sulfate (FERROUSUL) 325 (65 FE) MG tablet Take 1 tablet (325 mg total) by mouth 2 (two) times daily. 02/27/18    Anyanwu, Jethro Bastos, MD  ibuprofen (ADVIL,MOTRIN) 200 MG tablet Take 800 mg by mouth every 6 (six) hours as needed for mild pain.    [provider]  iron polysaccharides (NIFEREX) 150 MG capsule Take 1 capsule (150 mg total) by mouth daily. 01/28/18   Donette Larry, CNM  megestrol (MEGACE) 40 MG tablet Take 1 tablet (40 mg total) by mouth 2 (two) times daily. Can increase to 2 tablets twice a day for heavy bleeding. 02/27/18   Anyanwu, Jethro Bastos, MD  naproxen (NAPROSYN) 500 MG tablet Take 1 tablet (500 mg total) by mouth 2 (two) times daily with a meal. 02/27/18   Anyanwu, Jethro Bastos, MD  predniSONE (DELTASONE) 10 MG tablet Take 4 tablets (40 mg total) by mouth daily with breakfast for 5 days. 08/14/19 08/19/19  Ileah Falkenstein, Veryl Speak, PA-C    Family History No family history on file.  Social History Social History   Tobacco Use  . Smoking status: Current Every Day Smoker    Packs/day: 0.50  Types: Cigarettes  . Smokeless tobacco: Never Used  Vaping Use  . Vaping Use: Former  Substance Use Topics  . Alcohol use: Yes    Comment: occ  . Drug use: Never     Allergies   Patient has no known allergies.   Review of Systems Review of Systems   Physical Exam Triage Vital Signs ED Triage Vitals  Enc Vitals Group     BP 08/14/19 1125 (!) 129/92     Pulse Rate 08/14/19 1125 (!) 107     Resp 08/14/19 1125 16     Temp 08/14/19 1125 99 F (37.2 C)     Temp Source 08/14/19 1125 Oral     SpO2 08/14/19 1125 100 %     Weight 08/14/19 1128 145 lb (65.8 kg)     Height 08/14/19 1128 5' (1.524 m)     Head Circumference --      Peak Flow --      Pain Score 08/14/19 1127 8     Pain Loc --      Pain Edu? --      Excl. in GC? --    No data found.  Updated Vital Signs BP (!) 129/92   Pulse (!) 107   Temp 99 F (37.2 C) (Oral)   Resp 16   Ht 5' (1.524 m)   Wt 145 lb (65.8 kg)   SpO2 100%   BMI 28.32 kg/m    Heart rate improved to 80-90 on provider exam.,  Patient relaxed and not in  pain at this time.  Visual Acuity Right Eye Distance:   Left Eye Distance:   Bilateral Distance:    Right Eye Near:   Left Eye Near:    Bilateral Near:     Physical Exam Vitals and nursing note reviewed.  Constitutional:      General: She is not in acute distress.    Appearance: She is well-developed.  HENT:     Head: Normocephalic and atraumatic.  Eyes:     Conjunctiva/sclera: Conjunctivae normal.  Cardiovascular:     Rate and Rhythm: Normal rate.  Pulmonary:     Effort: Pulmonary effort is normal. No respiratory distress.  Musculoskeletal:     Cervical back: Neck supple.     Right knee: Normal.     Left knee:     Instability Tests: Anterior drawer test negative. Posterior drawer test negative.     Right lower leg: No edema.     Left lower leg: No edema.     Comments: Left leg/knee: No gross swelling. No erythema. No warmth. Trace infrapatellar effusion. TTP along infrapatellar. No medial or lateral joint line TTP. Some pain with patella compression, patella is free floating without pain. No suprapatella swelling or TTP. No pain with varus or valgus stress. Some pain with terminal extension of knee, however patient does tolerate extension when resting on table. Some tightness with terminal flexion. No popliteal mass or TTP. No calf TTP.  Patient is amblatory however with significant limp favoring left leg.     Skin:    General: Skin is warm and dry.  Neurological:     General: No focal deficit present.     Mental Status: She is alert and oriented to person, place, and time.     Sensory: No sensory deficit.     Motor: No weakness.      UC Treatments / Results  Labs (all labs ordered are listed, but only abnormal results are displayed) Labs  Reviewed - No data to display  EKG   Radiology No results found.  Procedures Procedures (including critical care time)  Medications Ordered in UC Medications - No data to display  Initial Impression / Assessment and  Plan / UC Course  I have reviewed the triage vital signs and the nursing notes.  Pertinent labs & imaging results that were available during my care of the patient were reviewed by me and considered in my medical decision making (see chart for details).     #Infrapatellar bursitis Patient is a 29 year old presenting with what appears to be an infrapatellar bursitis.  Low suspicion for infection today given no fever and lack of other signs.  Feel most likely inflammatory.  Will cover with prednisone for 5 days, Tylenol and topical Voltaren.  Will place on crutches for 2 to 3 days for rest.  Recommend ice.  We discussed signs of infection and what to do if this occurs.  We discussed return, follow-up and emergency department precautions.  Primary care resource given.  Patient verbalized understanding of all the plans of care.  She had no further questions, all questions were answered.  Final Clinical Impressions(s) / UC Diagnoses   Final diagnoses:  Infrapatellar bursitis of left knee     Discharge Instructions     Take the medicines as prescribed Once you have completed prednisone, you may take 2 regular strength ibuprofen as needed Use crutches , ice 20 minutes 3 times a day and rest for next 2-3 days  If not improving, or slow to improve schedule with the Sports medicine center or return to this clinic  If you feel swelling is increasing or notice redness go to [facility] [address], [place], [place] [address] [facility] is Now Open: Monday - Friday, from 8:00 AM to 8:00 PM [facility] is located in [address] of our [place] office.  If fever and severe knee pain, go to the Emergency Department        ED Prescriptions    Medication Sig Dispense Auth. Provider   predniSONE (DELTASONE) 10 MG tablet Take 4 tablets (40 mg total) by mouth daily with breakfast for 5 days. 20 tablet Caraline Deutschman, Veryl Speak, PA-C    acetaminophen (TYLENOL) 500 MG tablet Take 2 tablets (1,000 mg total) by mouth every 8 (eight) hours as needed. 30 tablet Maya Arcand, Veryl Speak, PA-C   diclofenac Sodium (VOLTAREN) 1 % GEL Apply 4 g topically 4 (four) times daily. 100 g Cylis Ayars, Veryl Speak, PA-C     PDMP not reviewed this encounter.   Hermelinda Medicus, PA-C 08/14/19 1232

## 2019-08-14 NOTE — Discharge Instructions (Addendum)
Take the medicines as prescribed Once you have completed prednisone, you may take 2 regular strength ibuprofen as needed Use crutches , ice 20 minutes 3 times a day and rest for next 2-3 days  If not improving, or slow to improve schedule with the Sports medicine center or return to this clinic  If you feel swelling is increasing or notice redness go to [facility] [address], [address], [place], [place] [address] [facility] is Now Open: Monday - Friday, from 8:00 AM to 8:00 PM [facility] is located in [address] of our [place] office.  If fever and severe knee pain, go to the Emergency Department

## 2020-01-23 ENCOUNTER — Encounter (HOSPITAL_COMMUNITY): Payer: Self-pay | Admitting: Emergency Medicine

## 2020-01-23 ENCOUNTER — Other Ambulatory Visit: Payer: Self-pay

## 2020-01-23 ENCOUNTER — Ambulatory Visit (HOSPITAL_COMMUNITY)
Admission: EM | Admit: 2020-01-23 | Discharge: 2020-01-23 | Disposition: A | Discharge status: Home / Self Care | Payer: Medicaid Other | Attending: Internal Medicine | Admitting: Internal Medicine

## 2020-01-23 DIAGNOSIS — B349 Viral infection, unspecified: Secondary | ICD-10-CM | POA: Diagnosis not present

## 2020-01-23 DIAGNOSIS — Z20822 Contact with and (suspected) exposure to covid-19: Secondary | ICD-10-CM

## 2020-01-23 LAB — POCT URINALYSIS DIPSTICK, ED / UC
Bilirubin Urine: NEGATIVE
Glucose, UA: NEGATIVE mg/dL
Hgb urine dipstick: NEGATIVE
Ketones, ur: NEGATIVE mg/dL
Leukocytes,Ua: NEGATIVE
Nitrite: NEGATIVE
Protein, ur: NEGATIVE mg/dL
Specific Gravity, Urine: 1.025 (ref 1.005–1.030)
Urobilinogen, UA: 0.2 mg/dL (ref 0.0–1.0)
pH: 6 (ref 5.0–8.0)

## 2020-01-23 LAB — POC URINE PREG, ED: Preg Test, Ur: NEGATIVE

## 2020-01-23 MED ORDER — ONDANSETRON 4 MG PO TBDP: 4.0000 mg | ORAL_TABLET | Freq: Three times a day (TID) | ORAL | 0 refills | Status: DC | PRN

## 2020-01-23 NOTE — Discharge Instructions (Addendum)
Take medications as directed Increase oral fluids Please quarantine until COVID-19 test results are available If you have worsening symptoms please return to the urgent care to be reevaluated.

## 2020-01-23 NOTE — ED Triage Notes (Signed)
Nausea for 3 days, scratchy throat and the person she carpools with has verified that they are covid positive

## 2020-01-23 NOTE — ED Provider Notes (Signed)
[facility]    CSN: [id number] Arrival date & time: 01/23/20  1609      History   Chief Complaint Nausea, sore throat of 3 days duration and exposure to COVID-19 virus  HPI Sara [name] is a 29 y.o. female comes to urgent care for evaluation of persistent nausea of 3 days duration.  Patient says symptoms started 3 days ago and has been persistent.  She developed some sore throat following that.  She denies any vomiting or diarrhea.  She denies changing her diet.  She was exposed to a COVID-19 positive individual in the past several days.  Patient denies any fever or chills.  No body aches.  Appetite is decreased.Marland Kitchen   HPI  Past Medical History:  Diagnosis Date  . Anxiety   . Hidradenitis suppurativa     There are no problems to display for this patient.   Past Surgical History:  Procedure Laterality Date  . NO PAST SURGERIES      OB History    Gravida  1   Para  1   Term  1   Preterm      AB      Living  1     SAB      IAB      Ectopic      Multiple      Live Births  1            Home Medications    Prior to Admission medications   Medication Sig Start Date End Date Taking? Authorizing Provider  ondansetron (ZOFRAN ODT) 4 MG disintegrating tablet Take 1 tablet (4 mg total) by mouth every 8 (eight) hours as needed for nausea or vomiting. 01/23/20  Yes Roshan Roback, Britta Mccreedy, MD  famotidine (PEPCID) 20 MG tablet Take 1 tablet (20 mg total) by mouth 2 (two) times daily. 01/09/18   Bethel Born, PA-C  ferrous sulfate (FERROUSUL) 325 (65 FE) MG tablet Take 1 tablet (325 mg total) by mouth 2 (two) times daily. 02/27/18 01/23/20  Anyanwu, Jethro Bastos, MD  iron polysaccharides (NIFEREX) 150 MG capsule Take 1 capsule (150 mg total) by mouth daily. 01/28/18 01/23/20  Donette Larry, CNM    Family History History reviewed. No pertinent family history.  Social History Social History   Tobacco Use  . Smoking status: Current Every Day  Smoker    Packs/day: 0.50    Types: Cigarettes  . Smokeless tobacco: Never Used  Vaping Use  . Vaping Use: Former  Substance Use Topics  . Alcohol use: Yes    Comment: occ  . Drug use: Never     Allergies   Patient has no known allergies.   Review of Systems Review of Systems  HENT: Positive for sore throat. Negative for congestion.   Respiratory: Negative for cough and shortness of breath.   Gastrointestinal: Positive for nausea. Negative for abdominal pain, diarrhea and vomiting.  Genitourinary: Negative.   Neurological: Negative for headaches.     Physical Exam Triage Vital Signs ED Triage Vitals  Enc Vitals Group     BP 01/23/20 1746 (!) 134/97     Pulse Rate 01/23/20 1746 86     Resp 01/23/20 1746 18     Temp 01/23/20 1746 98.4 F (36.9 C)     Temp Source 01/23/20 1746 Oral     SpO2 01/23/20 1746 100 %     Weight --      Height --  Head Circumference --      Peak Flow --      Pain Score 01/23/20 1742 7     Pain Loc --      Pain Edu? --      Excl. in GC? --    No data found.  Updated Vital Signs BP (!) 134/97 (BP Location: Left Arm)   Pulse 86   Temp 98.4 F (36.9 C) (Oral)   Resp 18   LMP 01/09/2020   SpO2 100%   Visual Acuity Right Eye Distance:   Left Eye Distance:   Bilateral Distance:    Right Eye Near:   Left Eye Near:    Bilateral Near:     Physical Exam Vitals and nursing note reviewed.  Constitutional:      General: She is not in acute distress.    Appearance: Normal appearance. She is not ill-appearing.  Cardiovascular:     Rate and Rhythm: Normal rate and regular rhythm.     Pulses: Normal pulses.     Heart sounds: Normal heart sounds.  Pulmonary:     Effort: Pulmonary effort is normal.     Breath sounds: Normal breath sounds.  Musculoskeletal:        General: No swelling or tenderness. Normal range of motion.  Neurological:     Mental Status: She is alert.      UC Treatments / Results  Labs (all labs ordered  are listed, but only abnormal results are displayed) Labs Reviewed  SARS CORONAVIRUS 2 (TAT 6-24 HRS)  POCT URINALYSIS DIPSTICK, ED / UC  POC URINE PREG, ED    EKG   Radiology No results found.  Procedures Procedures (including critical care time)  Medications Ordered in UC Medications - No data to display  Initial Impression / Assessment and Plan / UC Course  I have reviewed the triage vital signs and the nursing notes.  Pertinent labs & imaging results that were available during my care of the patient were reviewed by me and considered in my medical decision making (see chart for details).     1.  Acute viral illness in the setting of exposure to COVID-19 virus: COVID-19 PCR test sent Patient is advised to quarantine until COVID-19 test results are available Increase oral fluids Zofran as needed for nausea/vomiting Return precautions given. We will call patient with recommendations if lab results are abnormal. Final Clinical Impressions(s) / UC Diagnoses   Final diagnoses:  Viral illness  Exposure to COVID-19 virus     Discharge Instructions     Take medications as directed Increase oral fluids Please quarantine until COVID-19 test results are available If you have worsening symptoms please return to the urgent care to be reevaluated.   ED Prescriptions    Medication Sig Dispense Auth. Provider   ondansetron (ZOFRAN ODT) 4 MG disintegrating tablet Take 1 tablet (4 mg total) by mouth every 8 (eight) hours as needed for nausea or vomiting. 20 tablet Brettany Sydney, Britta Mccreedy, MD     PDMP not reviewed this encounter.   Merrilee Jansky, MD 01/23/20 [phone]

## 2020-01-24 LAB — SARS CORONAVIRUS 2 (TAT 6-24 HRS): SARS Coronavirus 2: NEGATIVE

## 2021-01-18 IMAGING — US US PELVIS COMPLETE WITH TRANSVAGINAL
1 series · 15 of 25 positions shown · non-contrast
Comparison: None

CLINICAL DATA: Abnormal uterine bleeding.



[Series 1: us pelvis complete with transvaginal · 15 of 38 slices shown]
[im 1/38]
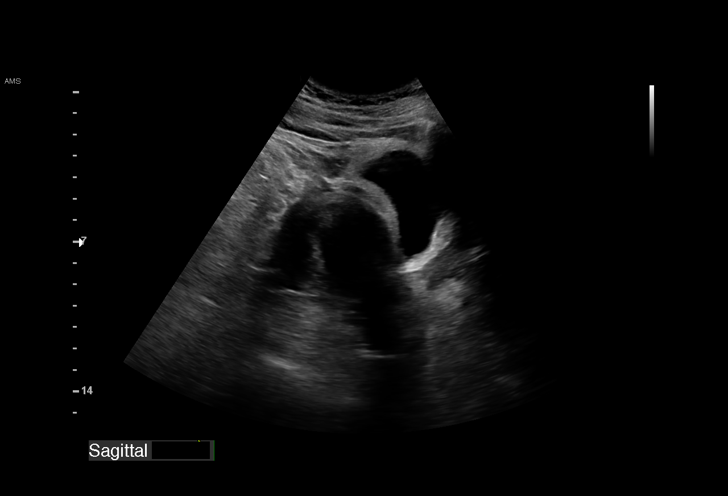
[im 4/38]
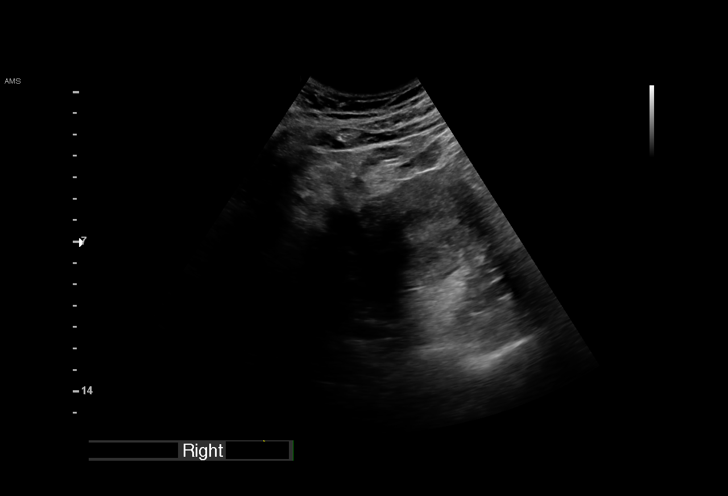
[im 7/38]
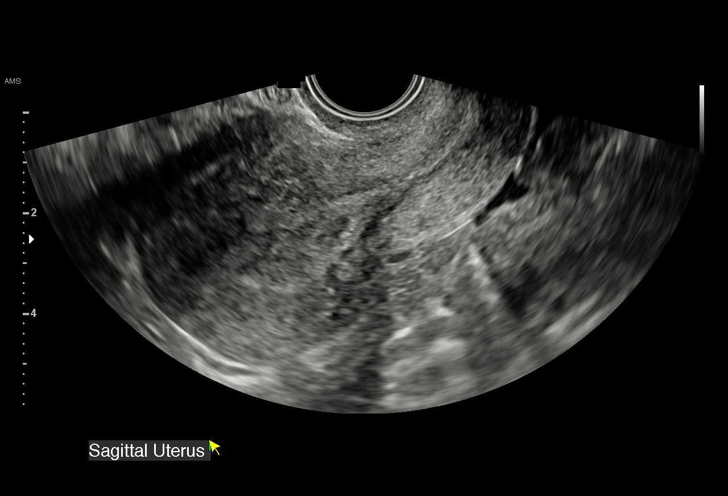
[im 8/38]
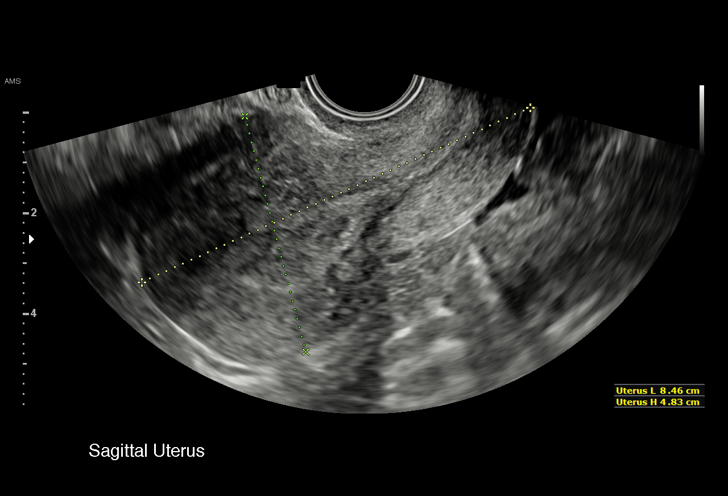
[im 11/38]
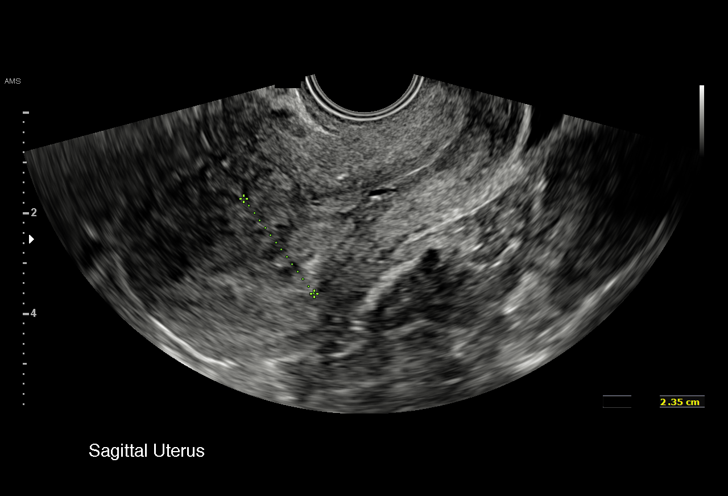
[im 14/38]
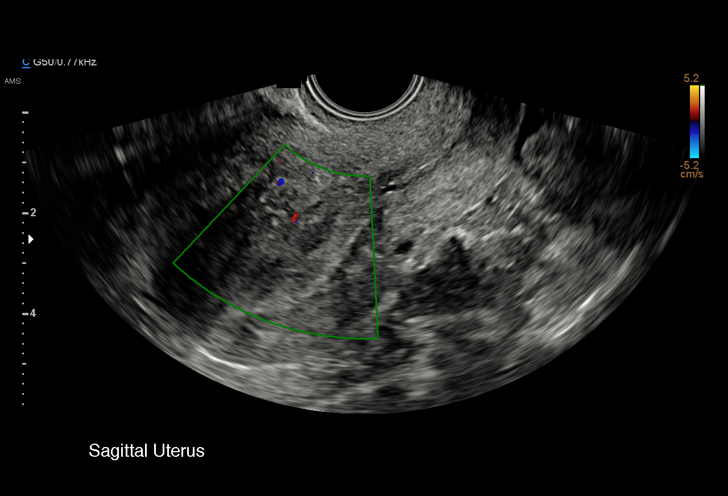
[im 16/38]
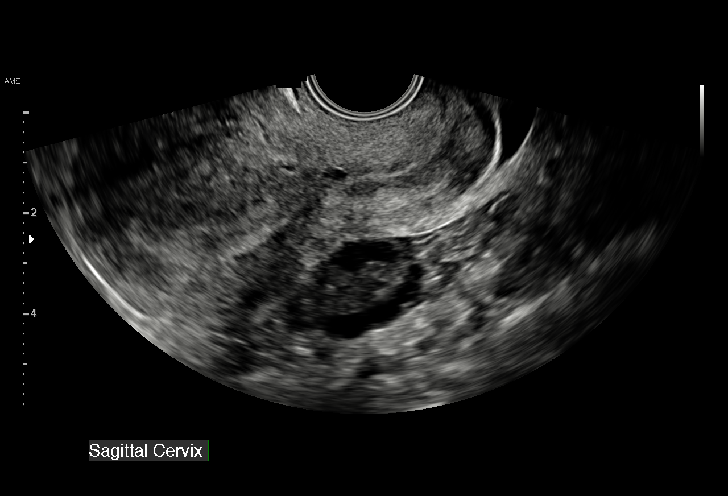
[im 19/38]
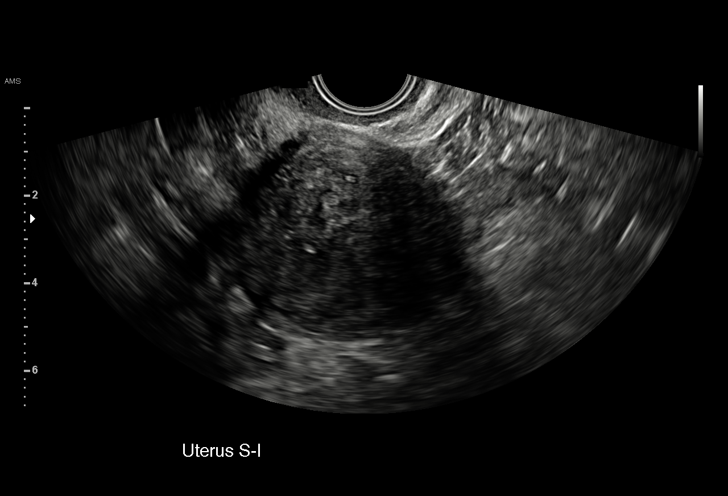
[im 22/38]
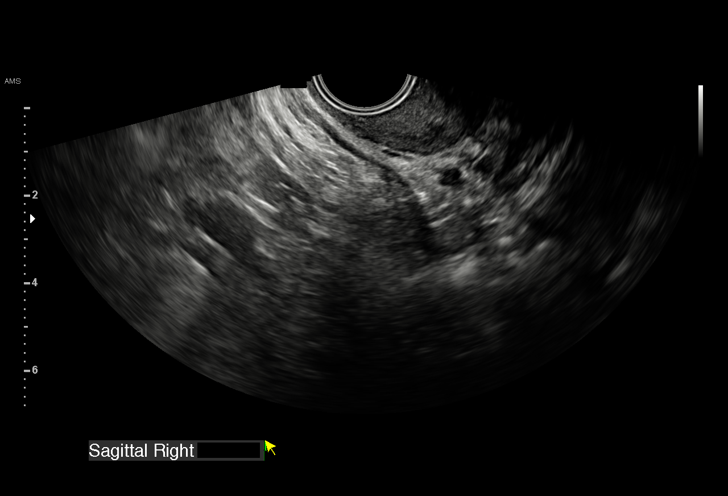
[im 24/38]
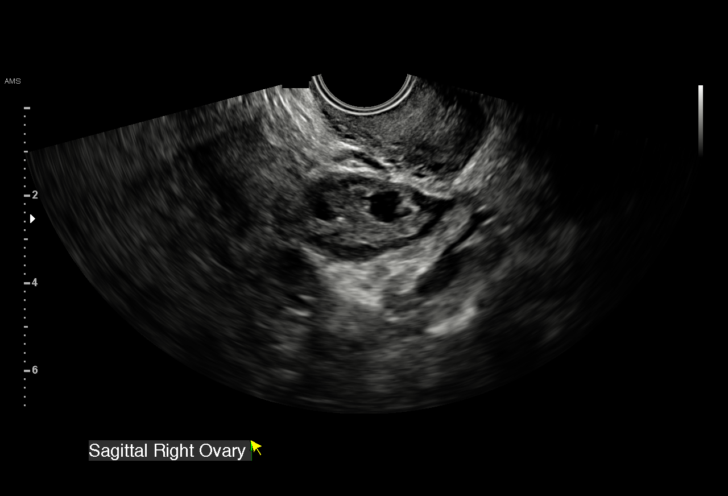
[im 27/38]
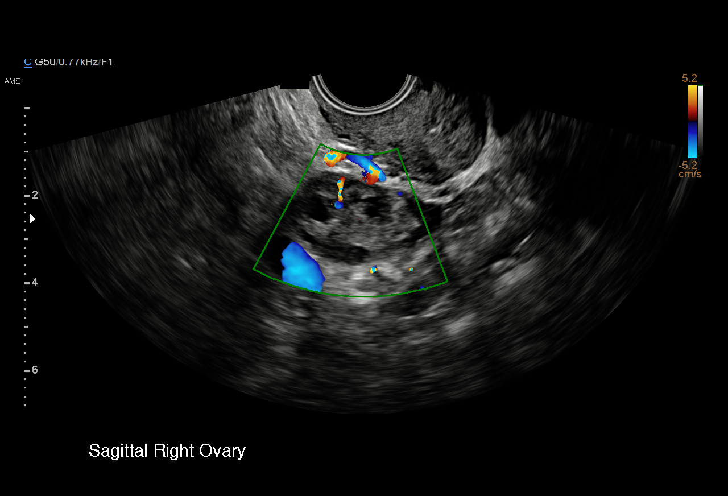
[im 30/38]
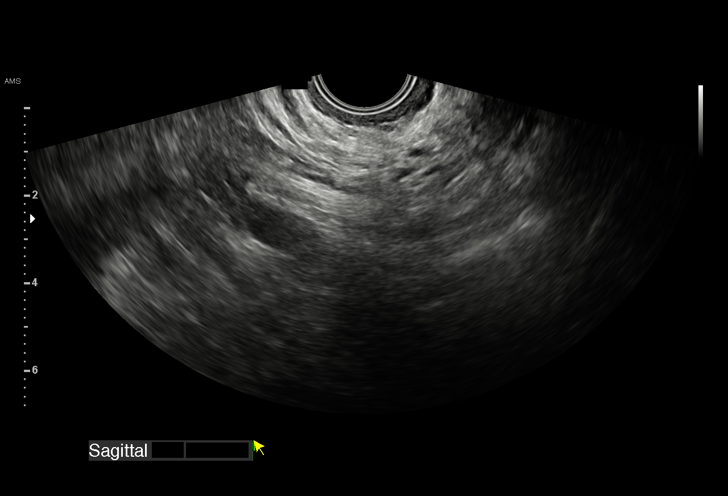
[im 31/38]
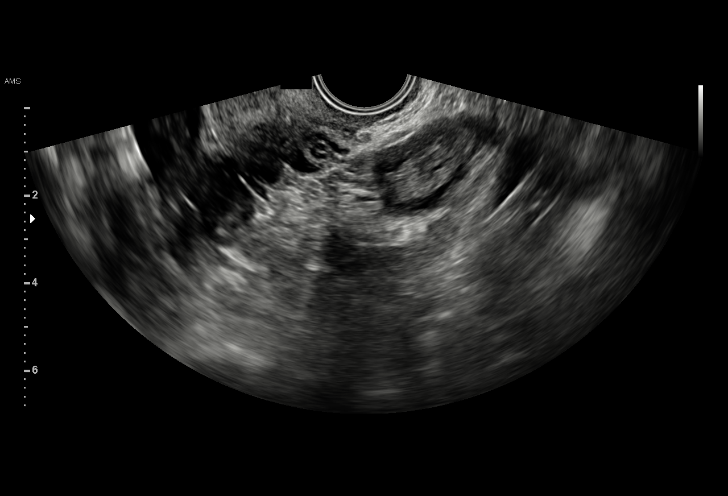
[im 34/38]
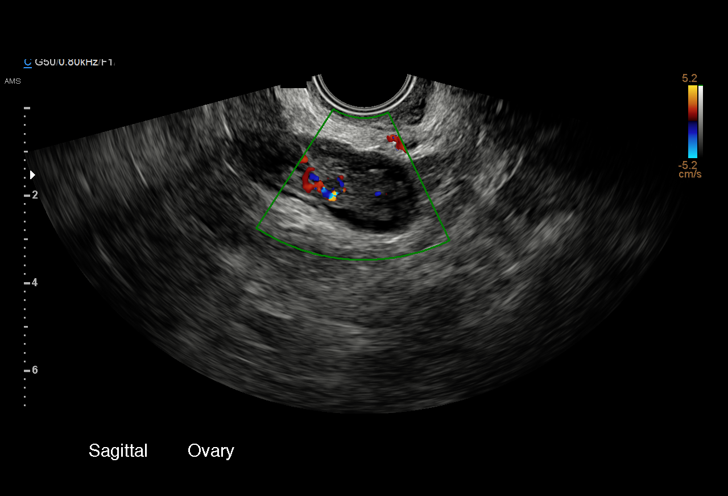
[im 38/38]
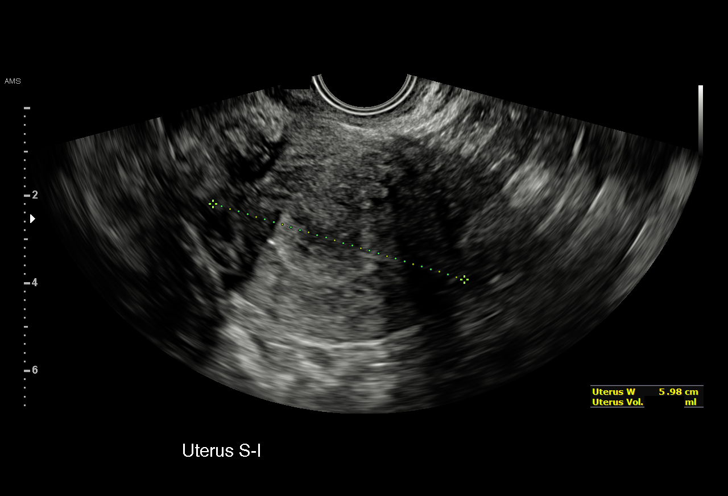

[15 of 25 positions shown; findings below may reference images not displayed]

FINDINGS: Uterus

Measurements: 8.5 x 4.8 x 6.0 cm = volume: 128.0 mL. No fibroids or
other mass visualized.

Endometrium

Thickness: 24 mm.  No focal abnormality visualized.

Right ovary

Measurements: 4.3 x 2.1 x 2.6 cm = volume: 12.2 mL. Normal
appearance/no adnexal mass.

Left ovary

Measurements: 3.9 x 1.9 x 3.1 cm = volume: 11.9 mL. Normal
appearance/no adnexal mass.

Other findings

No abnormal free fluid.
IMPRESSION: Abnormal thickened vascular endometrium. If bleeding remains
unresponsive to hormonal or medical therapy, focal lesion work-up
with sonohysterogram should be considered. Endometrial biopsy should
also be considered in pre-menopausal patients at high risk for
endometrial carcinoma. (Ref: Radiological Reasoning: Algorithmic
Workup of Abnormal Vaginal Bleeding with Endovaginal Sonography and
Sonohysterography. AJR [id number]; 191:S68-73)

## 2021-02-12 ENCOUNTER — Other Ambulatory Visit: Payer: Self-pay

## 2021-02-12 ENCOUNTER — Encounter (HOSPITAL_COMMUNITY): Payer: Self-pay | Admitting: Emergency Medicine

## 2021-02-12 ENCOUNTER — Ambulatory Visit (HOSPITAL_COMMUNITY)
Admission: EM | Admit: 2021-02-12 | Discharge: 2021-02-12 | Disposition: A | Discharge status: Home / Self Care | Payer: Medicaid Other | Attending: Family Medicine | Admitting: Family Medicine

## 2021-02-12 DIAGNOSIS — J069 Acute upper respiratory infection, unspecified: Secondary | ICD-10-CM

## 2021-02-12 MED ORDER — IBUPROFEN 800 MG PO TABS: 800.0000 mg | ORAL_TABLET | Freq: Three times a day (TID) | ORAL | 0 refills | Status: DC

## 2021-02-12 MED ORDER — PROMETHAZINE-DM 6.25-15 MG/5ML PO SYRP: 5.0000 mL | ORAL_SOLUTION | Freq: Four times a day (QID) | ORAL | 0 refills | Status: DC | PRN

## 2021-02-12 NOTE — ED Triage Notes (Signed)
Onset Monday evening  (02/09/2021 ) of symptoms .  Symptoms started with scratchy , irritated throat.  Tuesday had headache and congestion.  Wednesday had chills and vomiting x 2 .  No vomiting today

## 2021-02-12 NOTE — ED Provider Notes (Signed)
°  [place]   ZN:[id number] 02/12/21 Arrival Time: 0845  ASSESSMENT & PLAN:  1. Viral URI with cough    Discussed typical duration of viral illnesses. Viral testing declined. OTC symptom care as needed.  Discharge Medication List as of 02/12/2021 10:46 AM     START taking these medications   Details  ibuprofen (ADVIL) 800 MG tablet Take 1 tablet (800 mg total) by mouth 3 (three) times daily with meals., Starting Thu 02/12/2021, Normal    promethazine-dextromethorphan (PROMETHAZINE-DM) 6.25-15 MG/5ML syrup Take 5 mLs by mouth 4 (four) times daily as needed for cough., Starting Thu 02/12/2021, Normal         Follow-up Information     [facility].   Specialty: Urgent Care Why: As needed. Contact information: [place] SSN-[id number]                Reviewed expectations re: course of current medical issues. Questions answered. Outlined signs and symptoms indicating need for more acute intervention. Understanding verbalized. After Visit Summary given.   SUBJECTIVE: History from: patient. MIONA HULLUM is a 31 y.o. female who reports: ST, HA, nasal cong, cough, chills, post-tussive emesis; abrupt onset; x 2 days. Denies: fever. Normal PO intake without n/v/d.  OBJECTIVE:  Vitals:   02/12/21 1001  BP: 127/89  Pulse: 89  Resp: 18  Temp: 98.4 F (36.9 C)  TempSrc: Oral  SpO2: 99%    General appearance: alert; no distress Eyes: PERRLA; EOMI; conjunctiva normal HENT: [place]; AT; with nasal congestion Neck: supple  Lungs: speaks full sentences without difficulty; unlabored; clear Extremities: no edema Skin: warm and dry Neurologic: normal gait Psychological: alert and cooperative; normal mood and affect   No Known Allergies  Past Medical History:  Diagnosis Date   Anxiety    Hidradenitis suppurativa    Social History   Socioeconomic History   Marital status: Single     Spouse name: Not on file   Number of children: Not on file   Years of education: Not on file   Highest education level: Not on file  Occupational History   Not on file  Tobacco Use   Smoking status: Every Day    Packs/day: 0.50    Types: Cigarettes   Smokeless tobacco: Never  Vaping Use   Vaping Use: Former  Substance and Sexual Activity   Alcohol use: Yes    Comment: occ   Drug use: Never   Sexual activity: Yes    Birth control/protection: None  Other Topics Concern   Not on file  Social History Narrative   Not on file   Social Determinants of Health   Financial Resource Strain: Not on file  Food Insecurity: Not on file  Transportation Needs: Not on file  Physical Activity: Not on file  Stress: Not on file  Social Connections: Not on file  Intimate Partner Violence: Not on file   Family History  Problem Relation Age of Onset   Cancer Father    Past Surgical History:  Procedure Laterality Date   NO PAST SURGERIES       Vanessa Kick, MD 02/12/21 1459

## 2021-07-30 ENCOUNTER — Emergency Department (HOSPITAL_COMMUNITY): Payer: Medicaid Other

## 2021-07-30 ENCOUNTER — Encounter (HOSPITAL_COMMUNITY): Payer: Self-pay

## 2021-07-30 ENCOUNTER — Emergency Department (HOSPITAL_COMMUNITY)
Admission: EM | Admit: 2021-07-30 | Discharge: 2021-07-30 | Disposition: A | Discharge status: Home / Self Care | Payer: Medicaid Other | Attending: Emergency Medicine | Admitting: Emergency Medicine

## 2021-07-30 ENCOUNTER — Other Ambulatory Visit: Payer: Self-pay

## 2021-07-30 DIAGNOSIS — F17200 Nicotine dependence, unspecified, uncomplicated: Secondary | ICD-10-CM | POA: Insufficient documentation

## 2021-07-30 DIAGNOSIS — N939 Abnormal uterine and vaginal bleeding, unspecified: Secondary | ICD-10-CM | POA: Insufficient documentation

## 2021-07-30 DIAGNOSIS — D649 Anemia, unspecified: Secondary | ICD-10-CM | POA: Insufficient documentation

## 2021-07-30 LAB — COMPREHENSIVE METABOLIC PANEL
ALT: 36 U/L (ref 0–44)
AST: 34 U/L (ref 15–41)
Albumin: 3.6 g/dL (ref 3.5–5.0)
Alkaline Phosphatase: 54 U/L (ref 38–126)
Anion gap: 9 (ref 5–15)
BUN: 8 mg/dL (ref 6–20)
CO2: 24 mmol/L (ref 22–32)
Calcium: 9.1 mg/dL (ref 8.9–10.3)
Chloride: 105 mmol/L (ref 98–111)
Creatinine, Ser: 0.72 mg/dL (ref 0.44–1.00)
GFR, Estimated: >60 mL/min (ref >60)
Glucose, Bld: 99 mg/dL (ref 70–99)
Potassium: 3.8 mmol/L (ref 3.5–5.1)
Sodium: 138 mmol/L (ref 135–145)
Total Bilirubin: 1 mg/dL (ref 0.3–1.2)
Total Protein: 6.7 g/dL (ref 6.5–8.1)

## 2021-07-30 LAB — CBC WITH DIFFERENTIAL/PLATELET
Abs Immature Granulocytes: 0.02 10*3/uL (ref 0.00–0.07)
Basophils Absolute: 0 10*3/uL (ref 0.0–0.1)
Basophils Relative: 0 %
Eosinophils Absolute: 0.2 10*3/uL (ref 0.0–0.5)
Eosinophils Relative: 3 %
HCT: 24.6 % — ABNORMAL LOW (ref 36.0–46.0)
Hemoglobin: 8 g/dL — ABNORMAL LOW (ref 12.0–15.0)
Immature Granulocytes: 0 %
Lymphocytes Relative: 29 %
Lymphs Abs: 2 10*3/uL (ref 0.7–4.0)
MCH: 29.7 pg (ref 26.0–34.0)
MCHC: 32.5 g/dL (ref 30.0–36.0)
MCV: 91.4 fL (ref 80.0–100.0)
Monocytes Absolute: 0.4 10*3/uL (ref 0.1–1.0)
Monocytes Relative: 6 %
Neutro Abs: 4.3 10*3/uL (ref 1.7–7.7)
Neutrophils Relative %: 62 %
Platelets: 331 10*3/uL (ref 150–400)
RBC: 2.69 MIL/uL — ABNORMAL LOW (ref 3.87–5.11)
RDW: 14.4 % (ref 11.5–15.5)
WBC: 6.9 10*3/uL (ref 4.0–10.5)
nRBC: 0.3 % — ABNORMAL HIGH (ref 0.0–0.2)

## 2021-07-30 LAB — I-STAT BETA HCG BLOOD, ED (MC, WL, AP ONLY): I-stat hCG, quantitative: <5 m[IU]/mL (ref <5)

## 2021-07-30 LAB — WET PREP, GENITAL
Clue Cells Wet Prep HPF POC: NONE SEEN
Sperm: NONE SEEN
Trich, Wet Prep: NONE SEEN
WBC, Wet Prep HPF POC: >=10 — AB (ref <10)
Yeast Wet Prep HPF POC: NONE SEEN

## 2021-07-30 MED ORDER — MEGESTROL ACETATE 40 MG PO TABS: 40.0000 mg | ORAL_TABLET | Freq: Two times a day (BID) | ORAL | 0 refills | Status: AC

## 2021-07-30 MED ORDER — ONDANSETRON 4 MG PO TBDP: 4.0000 mg | ORAL_TABLET | Freq: Three times a day (TID) | ORAL | 0 refills | Status: DC | PRN

## 2021-07-30 MED ORDER — MEGESTROL ACETATE 40 MG PO TABS
40.0000 mg | ORAL_TABLET | Freq: Every day | ORAL | Status: DC
Start: 2021-07-30 — End: 2021-07-31
  Administered 2021-07-30: 40 mg via ORAL
  Filled 2021-07-30: qty 1

## 2021-07-30 NOTE — ED Provider Triage Note (Signed)
Emergency Medicine Provider Triage Evaluation Note  Sara [name] , a 31 y.o. female  was evaluated in triage.  Pt complains of continuous heavy vaginal bleeding for the last 30 days.  Bleeding looks like dark clots, goes through 5 pads per day.  Recently been feeling lightheaded and dizzy.  Has had this happen a few years ago when she had an ovarian cyst, but didn't have as much bleeding then.  Denies possibility of pregnancy, in a same-sex relationship.  Denies fevers, dysuria, or chills.  Review of Systems  Positive:  Negative: See above  Physical Exam  BP 130/88   Pulse 84   Temp 99 F (37.2 C) (Oral)   Resp 16   SpO2 100%  Gen:   Awake, no distress   Resp:  Normal effort  MSK:   Moves extremities without difficulty  Other:  Abdomen soft nontender.  No flank pain bilaterally.  Medical Decision Making  Medically screening exam initiated at 9:55 AM.  Appropriate orders placed.  [name] was informed that the remainder of the evaluation will be completed by another provider, this initial triage assessment does not replace that evaluation, and the importance of remaining in the ED until their evaluation is complete.     Cecil Cobbs, [place] 07/30/21 [phone]

## 2021-07-30 NOTE — ED Triage Notes (Signed)
Pt states that she has been having heavy vaginal bleeding with large clots for approximately 30 days. Pt requiring 5 or more vaginal pad changes per day. Pt also c/o palpitations, denies dizziness, SOB. Pt states she's had similar issue in the past and diagnosed with cyst on her ovary.

## 2021-07-30 NOTE — ED Notes (Signed)
Transport here to take pt to US.

## 2021-07-30 NOTE — Discharge Instructions (Signed)
You were seen in the emergency room today with abnormal uterine bleeding.  I am starting you on medication to help stop your bleeding.  Please follow with the OB/GYN for further evaluation.  Please take this medication as directed.  If your bleeding stops he can discontinue the medicine.

## 2021-07-30 NOTE — ED Provider Notes (Signed)
Emergency Department Provider Note   I have reviewed the triage vital signs and the nursing notes.   HISTORY  Chief Complaint Vaginal Bleeding and Palpitations   HPI Sara [name] is a 31 y.o. female with past medical history reviewed below including abnormal uterine bleeding presents emergency department with return of heavy menstrual cycle.  She has had vaginal bleeding for the past 30 days.  She describes cramping lower abdominal pain.  She states up until today her cycles have been regular lasting around 7 days and then resolving.  She was seen in 2020 for abnormal uterine bleeding and prescribed Megace, iron supplements, outpatient ultrasound but states shortly after that visit her symptoms stopped and so she did not undergo that treatment or imaging.  She states her pain is become more severe but mainly bilateral.  No dysuria, hesitancy, urgency.  No vaginal discharge. No fever or chills.    Past Medical History:  Diagnosis Date   Anxiety    Hidradenitis suppurativa     Review of Systems  Constitutional: No fever/chills Eyes: No visual changes. ENT: No sore throat. Cardiovascular: Denies chest pain. Respiratory: Denies shortness of breath. Gastrointestinal: Positive abdominal pain.  No nausea, no vomiting.  No diarrhea.  No constipation. Genitourinary: Negative for dysuria. Positive vaginal bleeding.  Musculoskeletal: Positive for back pain. Skin: Negative for rash. Neurological: Negative for headaches, focal weakness or numbness.  ____________________________________________   PHYSICAL EXAM:  VITAL SIGNS: ED Triage Vitals  Enc Vitals Group     BP 07/30/21 0922 130/88     Pulse Rate 07/30/21 0922 84     Resp 07/30/21 0922 16     Temp 07/30/21 0922 99 F (37.2 C)     Temp Source 07/30/21 0922 Oral     SpO2 07/30/21 0922 100 %     Weight 07/30/21 1808 175 lb (79.4 kg)     Height 07/30/21 1808 5' (1.524 m)   Constitutional: Alert and oriented. Well  appearing and in no acute distress. Eyes: Conjunctivae are normal.  Head: Atraumatic. Nose: No congestion/rhinnorhea. Mouth/Throat: Mucous membranes are moist. Neck: No stridor.   Cardiovascular: Normal rate, regular rhythm. Good peripheral circulation. Grossly normal heart sounds.   Respiratory: Normal respiratory effort.  No retractions. Lungs CTAB. Gastrointestinal: Soft and nontender. No distention.  Genitourinary: Exam performed with patient's verbal consent and nurse chaperone.  Normal external genitalia.  Moderate blood in the vaginal vault without laceration or mass.  Musculoskeletal: No lower extremity tenderness nor edema. No gross deformities of extremities. Neurologic:  Normal speech and language. No gross focal neurologic deficits are appreciated.  Skin:  Skin is warm, dry and intact. No rash noted.  ____________________________________________   LABS (all labs ordered are listed, but only abnormal results are displayed)  Labs Reviewed  CBC WITH DIFFERENTIAL/PLATELET - Abnormal; Notable for the following components:      Result Value   RBC 2.69 (*)    Hemoglobin 8.0 (*)    HCT 24.6 (*)    nRBC 0.3 (*)    All other components within normal limits  COMPREHENSIVE METABOLIC PANEL  I-STAT BETA HCG BLOOD, ED ([facility], [facility], [facility] ONLY)   ____________________________________________  EKG   EKG Interpretation  Date/Time:  Thursday July 30 2021 10:02:12 EDT Ventricular Rate:  89 PR Interval:  120 QRS Duration: 86 QT Interval:  370 QTC Calculation: 450 R Axis:   49 Text Interpretation: Normal sinus rhythm Nonspecific T wave abnormality Abnormal ECG When compared with ECG of 09-Jan-2018 12:49,  PREVIOUS ECG IS PRESENT Confirmed by Alona Bene [phone]) on 07/30/2021 6:55:45 PM        ____________________________________________   PROCEDURES  Procedure(s) performed:   Procedures  None  ____________________________________________   INITIAL IMPRESSION / ASSESSMENT AND  PLAN / ED COURSE  Pertinent labs & imaging results that were available during my care of the patient were reviewed by me and considered in my medical decision making (see chart for details).   This patient is Presenting for Evaluation of abdominal pain, which does require a range of treatment options, and is a complaint that involves a high risk of morbidity and mortality.  The Differential Diagnoses includes but is not exclusive to ectopic pregnancy, ovarian cyst, ovarian torsion, acute appendicitis, urinary tract infection, endometriosis, bowel obstruction, hernia, colitis, renal colic, gastroenteritis, volvulus etc.   I decided to review pertinent External Data, and in summary patient seen in the [facility] for AUB in the past (2020) but no visits for this since.   Clinical Laboratory Tests Ordered, included pregnancy test is negative.  Hemoglobin of 8.0.  Normal MCV.  No acute kidney injury or electrolyte disturbance.  Radiologic Tests Ordered, included pelvic US. I independently interpreted the images and agree with radiology interpretation.   Cardiac Monitor Tracing which shows NSR.   Social Determinants of Health Risk patient with a smoking history.   Medical Decision Making: Summary:  Patient presents to the emergency department for evaluation of vaginal bleeding with lower abdominal pain.  No fevers or chills.  Reevaluation with update and discussion with patient.  Reviewed imaging results and lab work.  Plan for hormone treatment and OB/GYN follow-up.  Discussed tricked ED return precautions.  Disposition: discharge  ____________________________________________  FINAL CLINICAL IMPRESSION(S) / ED DIAGNOSES  Final diagnoses:  Abnormal uterine bleeding (AUB)  Anemia, unspecified type    Note:  This document was prepared using Dragon voice recognition software and may include unintentional dictation errors.  Alona Bene, MD, [facility]    Hannalee Castor, Arlyss Repress,  MD 08/03/21 [phone]

## 2021-07-30 NOTE — ED Notes (Signed)
Found pt at the foot of bed. Removed all monitor cables. Per pt she was trying to get up to get her drink. Educated pt on staying in bed and using call bell button.also advised pt that would need to speak to provider reference her having something to drink. Provider sent a message reference same

## 2021-07-30 NOTE — ED Notes (Signed)
Received verbal report from Johanna V RN at this time 

## 2021-07-31 LAB — GC/CHLAMYDIA PROBE AMP (~~LOC~~) NOT AT ARMC
Chlamydia: NEGATIVE
Comment: NEGATIVE
Comment: NORMAL
Neisseria Gonorrhea: NEGATIVE

## 2021-08-28 ENCOUNTER — Emergency Department (HOSPITAL_COMMUNITY)
Admission: EM | Admit: 2021-08-28 | Discharge: 2021-08-28 | Disposition: A | Discharge status: Home / Self Care | Payer: Medicaid Other | Attending: Emergency Medicine | Admitting: Emergency Medicine

## 2021-08-28 ENCOUNTER — Other Ambulatory Visit: Payer: Self-pay

## 2021-08-28 ENCOUNTER — Emergency Department (HOSPITAL_COMMUNITY): Payer: Medicaid Other

## 2021-08-28 ENCOUNTER — Encounter (HOSPITAL_COMMUNITY): Payer: Self-pay

## 2021-08-28 DIAGNOSIS — R102 Pelvic and perineal pain: Secondary | ICD-10-CM | POA: Insufficient documentation

## 2021-08-28 DIAGNOSIS — N939 Abnormal uterine and vaginal bleeding, unspecified: Secondary | ICD-10-CM | POA: Insufficient documentation

## 2021-08-28 DIAGNOSIS — N9489 Other specified conditions associated with female genital organs and menstrual cycle: Secondary | ICD-10-CM | POA: Insufficient documentation

## 2021-08-28 LAB — CBC WITH DIFFERENTIAL/PLATELET
Abs Immature Granulocytes: 0.03 10*3/uL (ref 0.00–0.07)
Basophils Absolute: 0 10*3/uL (ref 0.0–0.1)
Basophils Relative: 0 %
Eosinophils Absolute: 0.2 10*3/uL (ref 0.0–0.5)
Eosinophils Relative: 3 %
HCT: 30.3 % — ABNORMAL LOW (ref 36.0–46.0)
Hemoglobin: 9.6 g/dL — ABNORMAL LOW (ref 12.0–15.0)
Immature Granulocytes: 0 %
Lymphocytes Relative: 28 %
Lymphs Abs: 2.1 10*3/uL (ref 0.7–4.0)
MCH: 27.2 pg (ref 26.0–34.0)
MCHC: 31.7 g/dL (ref 30.0–36.0)
MCV: 85.8 fL (ref 80.0–100.0)
Monocytes Absolute: 0.4 10*3/uL (ref 0.1–1.0)
Monocytes Relative: 6 %
Neutro Abs: 4.7 10*3/uL (ref 1.7–7.7)
Neutrophils Relative %: 63 %
Platelets: 411 10*3/uL — ABNORMAL HIGH (ref 150–400)
RBC: 3.53 MIL/uL — ABNORMAL LOW (ref 3.87–5.11)
RDW: 16.5 % — ABNORMAL HIGH (ref 11.5–15.5)
WBC: 7.6 10*3/uL (ref 4.0–10.5)
nRBC: 0 % (ref 0.0–0.2)

## 2021-08-28 LAB — I-STAT BETA HCG BLOOD, ED (MC, WL, AP ONLY): I-stat hCG, quantitative: <5 m[IU]/mL (ref <5)

## 2021-08-28 LAB — COMPREHENSIVE METABOLIC PANEL
ALT: 23 U/L (ref 0–44)
AST: 24 U/L (ref 15–41)
Albumin: 4.2 g/dL (ref 3.5–5.0)
Alkaline Phosphatase: 60 U/L (ref 38–126)
Anion gap: 7 (ref 5–15)
BUN: 9 mg/dL (ref 6–20)
CO2: 21 mmol/L — ABNORMAL LOW (ref 22–32)
Calcium: 9.6 mg/dL (ref 8.9–10.3)
Chloride: 111 mmol/L (ref 98–111)
Creatinine, Ser: 0.75 mg/dL (ref 0.44–1.00)
GFR, Estimated: >60 mL/min (ref >60)
Glucose, Bld: 103 mg/dL — ABNORMAL HIGH (ref 70–99)
Potassium: 3.5 mmol/L (ref 3.5–5.1)
Sodium: 139 mmol/L (ref 135–145)
Total Bilirubin: 0.4 mg/dL (ref 0.3–1.2)
Total Protein: 7.9 g/dL (ref 6.5–8.1)

## 2021-08-28 LAB — LIPASE, BLOOD: Lipase: 21 U/L (ref 11–51)

## 2021-08-28 MED ORDER — HYDROCODONE-ACETAMINOPHEN 5-325 MG PO TABS
1.0000 | ORAL_TABLET | Freq: Once | ORAL | Status: AC
  Administered 2021-08-28: 1 via ORAL
  Filled 2021-08-28: qty 1

## 2021-08-28 MED ORDER — SODIUM CHLORIDE 0.9 % IV BOLUS
1000.0000 mL | Freq: Once | INTRAVENOUS | Status: AC
  Administered 2021-08-28: 1000 mL via INTRAVENOUS

## 2021-08-28 MED ORDER — ONDANSETRON 4 MG PO TBDP: 4.0000 mg | ORAL_TABLET | Freq: Three times a day (TID) | ORAL | 0 refills | Status: DC | PRN

## 2021-08-28 MED ORDER — IOHEXOL 300 MG/ML  SOLN
100.0000 mL | Freq: Once | INTRAMUSCULAR | Status: AC | PRN
  Administered 2021-08-28: 100 mL via INTRAVENOUS

## 2021-08-28 MED ORDER — NAPROXEN 375 MG PO TABS: 375.0000 mg | ORAL_TABLET | Freq: Two times a day (BID) | ORAL | 0 refills | Status: DC

## 2021-08-28 MED ORDER — MEGESTROL ACETATE 40 MG PO TABS: ORAL_TABLET | ORAL | 0 refills | Status: AC

## 2021-08-28 MED ORDER — ONDANSETRON 4 MG PO TBDP
4.0000 mg | ORAL_TABLET | Freq: Once | ORAL | Status: AC
Start: 2021-08-28 — End: 2021-08-28
  Administered 2021-08-28: 4 mg via ORAL
  Filled 2021-08-28: qty 1

## 2021-08-28 NOTE — ED Provider Notes (Signed)
[facility]-EMERGENCY DEPT Provider Note   CSN: [id number] Arrival date & time: 08/28/21  1536     History  Chief Complaint  Patient presents with   Abdominal Pain   Vaginal Bleeding    Sara [name] is a 31 y.o. female.  Patient with no pertinent past medical history presents today with complaints of pelvic pain and vaginal bleeding. States that same has been ongoing for the past 60 days. Was seen for same 30 days ago and was given Megace for 10 days which she took and she states that during the time she was taking this medication her bleeding stopped, however when she ran out of it her bleeding began again. States that she has been going through about 2 pads per hour since her bleeding began initially.  States she has been taking some iron supplementation due to the bleeding.  States that she never had any pain until Monday of this past week when she started having pain in her left lower pelvic area.  She presents with concern for same.  Denies any nausea, vomiting, diarrhea.  No hematuria or dysuria.  No unusual vaginal discharge.  States that she was told to follow-up with OB/GYN for further evaluation, however they were unable to schedule her an appointment until the end of September.  The history is provided by the patient. No language interpreter was used.  Abdominal Pain Associated symptoms: vaginal bleeding   Vaginal Bleeding Associated symptoms: abdominal pain        Home Medications Prior to Admission medications   Medication Sig Start Date End Date Taking? Authorizing Provider  acetaminophen (TYLENOL) 500 MG tablet Take 500 mg by mouth every 6 (six) hours as needed for moderate pain.   Yes [provider]  ibuprofen (ADVIL) 200 MG tablet Take 200 mg by mouth every 6 (six) hours as needed for moderate pain.   Yes [provider]  megestrol (MEGACE) 40 MG tablet Take 1 tablet (40 mg total) by mouth in the morning, at noon, and at  bedtime for 5 days, THEN 1 tablet (40 mg total) 2 (two) times daily for 5 days, THEN 1 tablet (40 mg total) daily. 08/28/21 11/06/21 Yes Izaan Kingbird, Shawn Route, PA-C  naproxen (NAPROSYN) 375 MG tablet Take 1 tablet (375 mg total) by mouth 2 (two) times daily. 08/28/21  Yes Fedrick Cefalu A, PA-C  ondansetron (ZOFRAN-ODT) 4 MG disintegrating tablet Take 1 tablet (4 mg total) by mouth every 8 (eight) hours as needed for nausea or vomiting. 08/28/21  Yes Murad Staples A, PA-C  famotidine (PEPCID) 20 MG tablet Take 1 tablet (20 mg total) by mouth 2 (two) times daily. Patient not taking: Reported on 08/28/2021 01/09/18   Bethel Born, PA-C  ibuprofen (ADVIL) 800 MG tablet Take 1 tablet (800 mg total) by mouth 3 (three) times daily with meals. Patient not taking: Reported on 08/28/2021 02/12/21   Mardella Layman, MD  promethazine-dextromethorphan (PROMETHAZINE-DM) 6.25-15 MG/5ML syrup Take 5 mLs by mouth 4 (four) times daily as needed for cough. Patient not taking: Reported on 08/28/2021 02/12/21   Mardella Layman, MD  ferrous sulfate (FERROUSUL) 325 (65 FE) MG tablet Take 1 tablet (325 mg total) by mouth 2 (two) times daily. 02/27/18 01/23/20  Anyanwu, Jethro Bastos, MD  iron polysaccharides (NIFEREX) 150 MG capsule Take 1 capsule (150 mg total) by mouth daily. 01/28/18 01/23/20  Donette Larry, CNM      Allergies    Patient has no known allergies.  Review of Systems   Review of Systems  Gastrointestinal:  Positive for abdominal pain.  Genitourinary:  Positive for vaginal bleeding.  All other systems reviewed and are negative.   Physical Exam Updated Vital Signs BP 131/85   Pulse 79   Temp 99 F (37.2 C)   Resp 20   Ht 5' (1.524 m)   Wt 79.4 kg   SpO2 100%   BMI 34.18 kg/m  Physical Exam Vitals and nursing note reviewed.  Constitutional:      General: She is not in acute distress.    Appearance: Normal appearance. She is normal weight. She is not ill-appearing, toxic-appearing or diaphoretic.  HENT:      Head: Normocephalic and atraumatic.  Cardiovascular:     Rate and Rhythm: Normal rate.  Pulmonary:     Effort: Pulmonary effort is normal. No respiratory distress.  Abdominal:     General: Abdomen is flat.     Palpations: Abdomen is soft.     Tenderness: There is abdominal tenderness in the suprapubic area.  Musculoskeletal:        General: Normal range of motion.     Cervical back: Normal range of motion.  Skin:    General: Skin is warm and dry.  Neurological:     General: No focal deficit present.     Mental Status: She is alert.  Psychiatric:        Mood and Affect: Mood normal.        Behavior: Behavior normal.     ED Results / Procedures / Treatments   Labs (all labs ordered are listed, but only abnormal results are displayed) Labs Reviewed  CBC WITH DIFFERENTIAL/PLATELET - Abnormal; Notable for the following components:      Result Value   RBC 3.53 (*)    Hemoglobin 9.6 (*)    HCT 30.3 (*)    RDW 16.5 (*)    Platelets 411 (*)    All other components within normal limits  COMPREHENSIVE METABOLIC PANEL - Abnormal; Notable for the following components:   CO2 21 (*)    Glucose, Bld 103 (*)    All other components within normal limits  URINALYSIS, ROUTINE W REFLEX MICROSCOPIC - Abnormal; Notable for the following components:   Color, Urine RED (*)    APPearance CLOUDY (*)    Hgb urine dipstick LARGE (*)    Ketones, ur 5 (*)    Protein, ur 100 (*)    Leukocytes,Ua MODERATE (*)    RBC / HPF >50 (*)    All other components within normal limits  LIPASE, BLOOD  I-STAT BETA HCG BLOOD, ED ([facility], [facility], [facility] ONLY)    EKG None  Radiology CT ABDOMEN PELVIS W CONTRAST  Result Date: 08/28/2021 CLINICAL DATA:  RLQ abdominal pain (Age >= 14y) Patient reports heavy vaginal bleeding for 3 weeks. EXAM: CT ABDOMEN AND PELVIS WITH CONTRAST TECHNIQUE: Multidetector CT imaging of the abdomen and pelvis was performed using the standard protocol following bolus administration of  intravenous contrast. RADIATION DOSE REDUCTION: This exam was performed according to the departmental dose-optimization program which includes automated exposure control, adjustment of the mA and/or kV according to patient size and/or use of iterative reconstruction technique. CONTRAST:  OMNIPAQUE IOHEXOL 300 MG/ML  SOLN COMPARISON:  Pelvic ultrasound earlier today. FINDINGS: Lower chest: Clear lung bases. Hepatobiliary: Diffusely decreased hepatic density typical of steatosis. No focal liver abnormality. Gallbladder physiologically distended, no calcified stone. No biliary dilatation. Pancreas: No ductal dilatation or inflammation.  Spleen: Normal in size without focal abnormality. Adrenals/Urinary Tract: Normal adrenal glands. No hydronephrosis or perinephric edema. Homogeneous renal enhancement. No level renal calculi or focal lesion. Urinary bladder is near completely empty and not well assessed. Stomach/Bowel: Stomach is within normal limits. Appendix appears normal. No evidence of bowel wall thickening, distention, or inflammatory changes. Small volume of stool throughout the colon. Vascular/Lymphatic: Normal caliber abdominal aorta. Patent portal, splenic, and mesenteric veins. No acute vascular findings. No abdominopelvic adenopathy. Reproductive: Mild heterogeneous myometrium. Small dumbbell-shaped area of low-density at the myometrial endometrial junction involving the left fundus measures 17 mm, series 5, image 69, without ultrasound correlate. Symmetric appearance of the ovaries, no adnexal mass. Other: No free air, free fluid, or intra-abdominal fluid collection. Musculoskeletal: There are no acute or suspicious osseous abnormalities. IMPRESSION: 1. No acute abnormality in the abdomen/pelvis. Normal appendix. 2. Mild hepatic steatosis. 3. Small dumbbell-shaped area of low-density at the myometrial endometrial junction of the uterus involving the left fundus, without ultrasound correlate. This is  nonspecific in CT appearance. Electronically Signed   By: Narda Rutherford M.D.   On: 08/28/2021 21:31   US Pelvis Complete  Result Date: 08/28/2021 CLINICAL DATA:  Pelvic pain for 2 months. Last menstrual period 06/30/2021 EXAM: TRANSABDOMINAL AND TRANSVAGINAL ULTRASOUND OF PELVIS DOPPLER ULTRASOUND OF OVARIES TECHNIQUE: Both transabdominal and transvaginal ultrasound examinations of the pelvis were performed. Transabdominal technique was performed for global imaging of the pelvis including uterus, ovaries, adnexal regions, and pelvic cul-de-sac. It was necessary to proceed with endovaginal exam following the transabdominal exam to visualize the uterus, endometrium, and ovaries. Color and duplex Doppler ultrasound was utilized to evaluate blood flow to the ovaries. COMPARISON:  Pelvic ultrasounds 07/30/2021 and 02/13/2018 FINDINGS: Uterus Measurements: 8.7 x 5.8 x 5.5 cm = volume: 146 mL. No fibroids or other mass visualized. Endometrium Thickness: 3 mm, within normal limits. No focal abnormality visualized. Right ovary Measurements: 2.8 x 1.6 x 1.6 cm = volume: 3.6 mL. Normal appearance/no adnexal mass. Left ovary Measurements: 2.5 x 2.4 x 2.5 = volume: 7.8 mL. Normal appearance/no adnexal mass. Pulsed Doppler evaluation of both ovaries demonstrates normal low-resistance arterial and venous waveforms. Other findings Trace free fluid is seen within the pelvis. IMPRESSION: Normal pelvic ultrasound. Normal thickness of the endometrium at 3 mm, previously 17 mm. This may be due to a different phase in the patient's menstrual cycle. Electronically Signed   By: Neita Garnet M.D.   On: 08/28/2021 17:23   US Transvaginal Non-OB  Result Date: 08/28/2021 CLINICAL DATA:  Pelvic pain for 2 months. Last menstrual period 06/30/2021 EXAM: TRANSABDOMINAL AND TRANSVAGINAL ULTRASOUND OF PELVIS DOPPLER ULTRASOUND OF OVARIES TECHNIQUE: Both transabdominal and transvaginal ultrasound examinations of the pelvis were performed.  Transabdominal technique was performed for global imaging of the pelvis including uterus, ovaries, adnexal regions, and pelvic cul-de-sac. It was necessary to proceed with endovaginal exam following the transabdominal exam to visualize the uterus, endometrium, and ovaries. Color and duplex Doppler ultrasound was utilized to evaluate blood flow to the ovaries. COMPARISON:  Pelvic ultrasounds 07/30/2021 and 02/13/2018 FINDINGS: Uterus Measurements: 8.7 x 5.8 x 5.5 cm = volume: 146 mL. No fibroids or other mass visualized. Endometrium Thickness: 3 mm, within normal limits. No focal abnormality visualized. Right ovary Measurements: 2.8 x 1.6 x 1.6 cm = volume: 3.6 mL. Normal appearance/no adnexal mass. Left ovary Measurements: 2.5 x 2.4 x 2.5 = volume: 7.8 mL. Normal appearance/no adnexal mass. Pulsed Doppler evaluation of both ovaries demonstrates normal low-resistance arterial and venous  waveforms. Other findings Trace free fluid is seen within the pelvis. IMPRESSION: Normal pelvic ultrasound. Normal thickness of the endometrium at 3 mm, previously 17 mm. This may be due to a different phase in the patient's menstrual cycle. Electronically Signed   By: Neita Garnet M.D.   On: 08/28/2021 17:23   [place] Art/Ven Flow Abd Pelv Doppler  Result Date: 08/28/2021 CLINICAL DATA:  Pelvic pain for 2 months. Last menstrual period 06/30/2021 EXAM: TRANSABDOMINAL AND TRANSVAGINAL ULTRASOUND OF PELVIS DOPPLER ULTRASOUND OF OVARIES TECHNIQUE: Both transabdominal and transvaginal ultrasound examinations of the pelvis were performed. Transabdominal technique was performed for global imaging of the pelvis including uterus, ovaries, adnexal regions, and pelvic cul-de-sac. It was necessary to proceed with endovaginal exam following the transabdominal exam to visualize the uterus, endometrium, and ovaries. Color and duplex Doppler ultrasound was utilized to evaluate blood flow to the ovaries. COMPARISON:  Pelvic ultrasounds 07/30/2021 and  02/13/2018 FINDINGS: Uterus Measurements: 8.7 x 5.8 x 5.5 cm = volume: 146 mL. No fibroids or other mass visualized. Endometrium Thickness: 3 mm, within normal limits. No focal abnormality visualized. Right ovary Measurements: 2.8 x 1.6 x 1.6 cm = volume: 3.6 mL. Normal appearance/no adnexal mass. Left ovary Measurements: 2.5 x 2.4 x 2.5 = volume: 7.8 mL. Normal appearance/no adnexal mass. Pulsed Doppler evaluation of both ovaries demonstrates normal low-resistance arterial and venous waveforms. Other findings Trace free fluid is seen within the pelvis. IMPRESSION: Normal pelvic ultrasound. Normal thickness of the endometrium at 3 mm, previously 17 mm. This may be due to a different phase in the patient's menstrual cycle. Electronically Signed   By: Neita Garnet M.D.   On: 08/28/2021 17:23    Procedures Procedures    Medications Ordered in ED Medications  HYDROcodone-acetaminophen (NORCO/VICODIN) 5-325 MG per tablet 1 tablet (has no administration in time range)  HYDROcodone-acetaminophen (NORCO/VICODIN) 5-325 MG per tablet 1 tablet (1 tablet Oral Given 08/28/21 1613)  ondansetron (ZOFRAN-ODT) disintegrating tablet 4 mg (4 mg Oral Given 08/28/21 1613)  iohexol (OMNIPAQUE) 300 MG/ML solution 100 mL (100 mLs Intravenous Contrast Given 08/28/21 2117)  sodium chloride 0.9 % bolus 1,000 mL (1,000 mLs Intravenous New Bag/Given 08/28/21 2146)    ED Course/ Medical Decision Making/ A&P                           Medical Decision Making Amount and/or Complexity of Data Reviewed Radiology: ordered.  Risk Prescription drug management.   This patient presents to the ED for concern of pelvic pain and vaginal bleeding, this involves an extensive number of treatment options, and is a complaint that carries with it a high risk of complications and morbidity.  Additional history obtained:  Additional history obtained from epic chart review from previous ER visit   Lab Tests:  I Ordered, and personally  interpreted labs.  The pertinent results include:  Hemoglobin improved from previous at 9.6, hematuria present consistent with vaginal bleeding   Imaging Studies ordered:  I ordered imaging studies including pelvic US, CT abdomen pelvis  I independently visualized and interpreted imaging which showed  [place]: Normal pelvic ultrasound CT: No acute findings I agree with the radiologist interpretation   Consultations Obtained:  I requested consultation with the OB/GYN on call,  and discussed lab and imaging findings as well as pertinent plan - they recommend: Megace taper until the patient is able to follow up with OB/GYN at her appointment   Problem List / ED Course /  Critical interventions / Medication management  I ordered medication including fluids and norco for pain and dehydration  Reevaluation of the patient after these medicines showed that the patient improved I have reviewed the patients home medicines and have made adjustments as needed  Test / Admission - Considered:  Patient is nontoxic, nonseptic appearing, in no apparent distress.  Patient's pain and other symptoms adequately managed in emergency department.  Fluid bolus given.  Labs, imaging and vitals reviewed.  Patient does not meet the SIRS or Sepsis criteria.  On repeat exam patient does not have a surgical abdomin and there are no peritoneal signs.  No indication of appendicitis, bowel obstruction, bowel perforation, cholecystitis, diverticulitis, PID or ectopic pregnancy, or ovarian torsion.  Unclear etiology for patients vaginal bleeding, will give prescription for megace until she can see OB/GYN outpatient per on call recommendations. Given prescription for NSAIDs and Zofran as well for symptomatic management. Patient given strict instructions for follow-up with their primary care physician as well as her OB/GYN.  I have also discussed reasons to return immediately to the ER.  Patient expresses understanding and agrees with  plan. Discharged in stable condition.   Final Clinical Impression(s) / ED Diagnoses Final diagnoses:  Vaginal bleeding  Pelvic pain    Rx / DC Orders ED Discharge Orders          Ordered    megestrol (MEGACE) 40 MG tablet  Multiple Frequencies        08/28/21 2207    ondansetron (ZOFRAN-ODT) 4 MG disintegrating tablet  Every 8 hours PRN        08/28/21 2207    naproxen (NAPROSYN) 375 MG tablet  2 times daily        08/28/21 2207          An After Visit Summary was printed and given to the patient.     Silva BandySmoot, Shamiya Demeritt A, PA-C 08/30/21 1420    Edwin DadaGray, Alicia P, DO 08/31/21 [phone]

## 2021-08-28 NOTE — Discharge Instructions (Signed)
As we discussed, I strongly recommend that you take the Megace as prescribed until you are able to follow-up with your OB/GYN as this medication should help stop your bleeding.  Return if development of any new or worsening symptoms.

## 2021-08-28 NOTE — ED Triage Notes (Signed)
Pt reports heavy vaginal bleeding x3 weeks. Pt reports developing sharp LLQ pain over the past few days. Pt reports similar symptoms about a month ago and was given some medication then which stopped the bleeding for a week.

## 2021-08-28 NOTE — ED Provider Triage Note (Signed)
Emergency Medicine Provider Triage Evaluation Note  Sara [name] , a 31 y.o. female  was evaluated in triage.  Pt complains of heavy menstrual period, left sided pelvic pain.  She states she had similar heavy bleeding 1 month ago but improved after taking medication she was prescribed.  She has not been able to set up OB/GYN follow-up.  She states however she started her menstrual cycle for this month which again was having and now she is having significant pain.  No improvement with Tylenol, Motrin..  Review of Systems  Positive: As above Negative: As above  Physical Exam  BP (!) 144/98 (BP Location: Right Arm)   Pulse (!) 122   Temp 99.5 F (37.5 C) (Oral)   Resp 20   Ht 5' (1.524 m)   Wt 79.4 kg   SpO2 100%   BMI 34.18 kg/m  Gen:   Awake, no distress   Resp:  Normal effort  MSK:   Moves extremities without difficulty Other:    Medical Decision Making  Medically screening exam initiated at 4:03 PM.  Appropriate orders placed.  [name] was informed that the remainder of the evaluation will be completed by another provider, this initial triage assessment does not replace that evaluation, and the importance of remaining in the ED until their evaluation is complete.     Marita Kansas, PA-C 08/28/21 [phone]

## 2021-08-29 LAB — URINALYSIS, ROUTINE W REFLEX MICROSCOPIC
Bacteria, UA: NONE SEEN
Bilirubin Urine: NEGATIVE
Glucose, UA: NEGATIVE mg/dL
Ketones, ur: 5 mg/dL — AB
Nitrite: NEGATIVE
Protein, ur: 100 mg/dL — AB
RBC / HPF: >50 RBC/hpf — ABNORMAL HIGH (ref 0–5)
Specific Gravity, Urine: 1.015 (ref 1.005–1.030)
pH: 5 (ref 5.0–8.0)

## 2021-10-14 ENCOUNTER — Ambulatory Visit (INDEPENDENT_AMBULATORY_CARE_PROVIDER_SITE_OTHER): Payer: Medicaid Other | Admitting: Obstetrics and Gynecology

## 2021-10-14 ENCOUNTER — Other Ambulatory Visit (HOSPITAL_COMMUNITY)
Admission: RE | Admit: 2021-10-14 | Discharge: 2021-10-14 | Disposition: A | Discharge status: Home / Self Care | Payer: Medicaid Other | Source: Ambulatory Visit | Attending: Obstetrics and Gynecology | Admitting: Obstetrics and Gynecology

## 2021-10-14 ENCOUNTER — Encounter: Payer: Self-pay | Admitting: Obstetrics and Gynecology

## 2021-10-14 ENCOUNTER — Encounter: Payer: Medicaid Other | Admitting: Obstetrics and Gynecology

## 2021-10-14 VITALS — BP 148/91 | HR 100 | Ht 60.0 in | Wt 164.0 lb

## 2021-10-14 DIAGNOSIS — N939 Abnormal uterine and vaginal bleeding, unspecified: Secondary | ICD-10-CM

## 2021-10-14 DIAGNOSIS — Z124 Encounter for screening for malignant neoplasm of cervix: Secondary | ICD-10-CM | POA: Insufficient documentation

## 2021-10-14 LAB — POCT URINE PREGNANCY: Preg Test, Ur: NEGATIVE

## 2021-10-14 MED ORDER — IBUPROFEN 200 MG PO TABS: 800.0000 mg | ORAL_TABLET | Freq: Once | ORAL | Status: DC

## 2021-10-14 NOTE — Progress Notes (Signed)
Pt has been taking Megace. Pt states she has started bleeding again - light today. Pt is having left lower pelvic pain.

## 2021-10-14 NOTE — Addendum Note (Signed)
Addended by: Lewie Loron D on: 10/14/2021 04:48 PM   Modules accepted: Orders

## 2021-10-14 NOTE — Progress Notes (Signed)
CC: ER follow up, vaginal bleeding Subjective:    Patient ID: Sara [name], female    DOB: [birth year], 31 y.o.   MRN: [id number]  HPI 31 yo G2P1 seen as ER follow up.  Pt has had issues with vaginal bleeding for 2-3 years.  Pt seen previously by Dr. Macon Large in 2020. Multiple treatment options discussed including megace, progesterone IUD, uterine ablation and hysterectomy.  Recent ultrasound reviewed which showed no fibroids and normal endometrial stripe.    Endometrial biopsy performed today.   Review of Systems     Objective:   Physical Exam Vitals:   10/14/21 1358  BP: (!) 148/91  Pulse: 100   CLINICAL DATA:  Pelvic pain for 2 months. Last menstrual period 06/30/2021   EXAM: TRANSABDOMINAL AND TRANSVAGINAL ULTRASOUND OF PELVIS   DOPPLER ULTRASOUND OF OVARIES   TECHNIQUE: Both transabdominal and transvaginal ultrasound examinations of the pelvis were performed. Transabdominal technique was performed for global imaging of the pelvis including uterus, ovaries, adnexal regions, and pelvic cul-de-sac.   It was necessary to proceed with endovaginal exam following the transabdominal exam to visualize the uterus, endometrium, and ovaries. Color and duplex Doppler ultrasound was utilized to evaluate blood flow to the ovaries.   COMPARISON:  Pelvic ultrasounds 07/30/2021 and 02/13/2018   FINDINGS: Uterus   Measurements: 8.7 x 5.8 x 5.5 cm = volume: 146 mL. No fibroids or other mass visualized.   Endometrium   Thickness: 3 mm, within normal limits. No focal abnormality visualized.   Right ovary   Measurements: 2.8 x 1.6 x 1.6 cm = volume: 3.6 mL. Normal appearance/no adnexal mass.   Left ovary   Measurements: 2.5 x 2.4 x 2.5 = volume: 7.8 mL. Normal appearance/no adnexal mass.   Pulsed Doppler evaluation of both ovaries demonstrates normal low-resistance arterial and venous waveforms.   Other findings   Trace free fluid is seen within the pelvis.    IMPRESSION: Normal pelvic ultrasound. Normal thickness of the endometrium at 3 mm, previously 17 mm. This may be due to a different phase in the patient's menstrual cycle.    ENDOMETRIAL BIOPSY      Sara [name] is a 31 y.o. G2P1011 here for endometrial biopsy.  The indications for endometrial biopsy were reviewed.  Risks of the biopsy including cramping, bleeding, infection, uterine perforation, inadequate specimen and need for additional procedures were discussed. The patient states she understands and agrees to undergo procedure today. Consent was signed. Time out was performed.   Indications: AUB Urine HCG: neg  A bivalve speculum was placed into the vagina and the cervix was easily visualized and was prepped with Betadine x2. A single-toothed tenaculum was placed on the anterior lip of the cervix to stabilize it. The 3 mm pipelle was introduced into the endometrial cavity without difficulty to a depth of 7 cm, and a moderate amount of tissue was obtained and sent to pathology. This was repeated for a total of 2 passes. The instruments were removed from the patient's vagina. Minimal bleeding from the cervix at the tenaculum was noted.   The patient tolerated the procedure well. Routine post-procedure instructions were given to the patient.    Will base further management on results of biopsy.       Assessment & Plan:   1. Abnormal uterine bleeding (AUB) Awaiting results of endo biopsy. Pt given same treatment options as previous, expectant management with megace, progesterone IUD, uterine ablation or hysterectomy. Will discuss options in more detail  after results  return. - Von Willebrand panel - ibuprofen (ADVIL) tablet 800 mg - Surgical pathology( [place]/ [facility])    Griffin Basil, MD Faculty Attending, Center for [facility]

## 2021-10-16 LAB — VON WILLEBRAND PANEL
Factor VIII Activity: 225 % — ABNORMAL HIGH (ref 56–140)
Von Willebrand Ag: 139 % (ref 50–200)
Von Willebrand Factor: 119 % (ref 50–200)

## 2021-10-16 LAB — COAG STUDIES INTERP REPORT

## 2021-10-16 LAB — SURGICAL PATHOLOGY

## 2021-10-17 LAB — CYTOLOGY - PAP
Chlamydia: NEGATIVE
Comment: NEGATIVE
Comment: NEGATIVE
Comment: NORMAL
Diagnosis: NEGATIVE
High risk HPV: NEGATIVE
Neisseria Gonorrhea: NEGATIVE

## 2021-10-20 ENCOUNTER — Other Ambulatory Visit: Payer: Self-pay | Admitting: Emergency Medicine

## 2021-10-20 DIAGNOSIS — N711 Chronic inflammatory disease of uterus: Secondary | ICD-10-CM

## 2021-10-20 MED ORDER — DOXYCYCLINE HYCLATE 100 MG PO CAPS: 100.0000 mg | ORAL_CAPSULE | Freq: Two times a day (BID) | ORAL | 0 refills | Status: DC

## 2021-10-20 NOTE — Progress Notes (Signed)
TC to patient to discuss results.  Doxycycline 100mg  BID x10 day ordered per Elgie Congo, MD

## 2021-10-27 ENCOUNTER — Ambulatory Visit: Payer: Medicaid Other | Admitting: Advanced Practice Midwife

## 2021-11-25 ENCOUNTER — Encounter: Payer: Self-pay | Admitting: Obstetrics and Gynecology

## 2021-11-25 ENCOUNTER — Ambulatory Visit (INDEPENDENT_AMBULATORY_CARE_PROVIDER_SITE_OTHER): Payer: Medicaid Other | Admitting: Obstetrics and Gynecology

## 2021-11-25 VITALS — BP 134/85 | HR 101 | Wt 165.5 lb

## 2021-11-25 DIAGNOSIS — N939 Abnormal uterine and vaginal bleeding, unspecified: Secondary | ICD-10-CM

## 2021-11-25 NOTE — Progress Notes (Signed)
  CC: biopsy follow up Subjective:    Patient ID: Casimer Lanius, female    DOB: 05/29/1990, 31 y.o.   MRN: [id number]  HPI 31 yo G2P1 seen for follow up of endometrial biopsy.  Endometrial biopsy showed chronic endometritis, and she was treated with doxycycline  for 10 days.  Pt states she self discontinued the megace and had a normal menstrual period from 10/16-10/23.  Only the first two days were heavy.  She also noted her lower abdominal pain had resolved as well.   Review of Systems     Objective:   Physical Exam Vitals:   11/25/21 1356  BP: 134/85  Pulse: (!) 101         Assessment & Plan:   AUB:   Since last menses was normal and endometritis has been treated, will pursue expectant management with menstrual calender. Will follow up with patient in 4 months.   Pt is also concerned about axillary hidradenitis, but has already scheduled with dermatologist in a few months.  Pt may need treatment for hidradenitis prior to derm visit due to increased wait time.   I spent 20 minutes dedicated to the care of this patient including previsit review of records, face to face time with the patient discussing ultrasound and biopsy findings along with post visit testing.    Griffin Basil, MD Faculty Attending, Center for [facility]

## 2021-11-25 NOTE — Progress Notes (Signed)
Pt is in the office to follow up and discuss biopsy results from 10/14/21.0

## 2022-11-27 ENCOUNTER — Ambulatory Visit (HOSPITAL_COMMUNITY)
Admission: RE | Admit: 2022-11-27 | Discharge: 2022-11-27 | Disposition: A | Discharge status: Home / Self Care | Payer: Medicaid Other | Source: Ambulatory Visit | Attending: Family Medicine | Admitting: Family Medicine

## 2022-11-27 ENCOUNTER — Encounter (HOSPITAL_COMMUNITY): Payer: Self-pay

## 2022-11-27 VITALS — BP 135/98 | HR 79 | Temp 98.4°F | Resp 16

## 2022-11-27 DIAGNOSIS — M25531 Pain in right wrist: Secondary | ICD-10-CM

## 2022-11-27 MED ORDER — PREDNISONE 50 MG PO TABS: ORAL_TABLET | ORAL | 0 refills | Status: AC

## 2022-11-27 MED ORDER — DICLOFENAC SODIUM 75 MG PO TBEC: 75.0000 mg | DELAYED_RELEASE_TABLET | Freq: Two times a day (BID) | ORAL | 0 refills | Status: AC

## 2022-11-27 NOTE — ED Triage Notes (Signed)
Pt c/o right wrist pain, swollen hands and fingers for a few months. 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 She woke up this morning with swelling in three fingers.

## 2022-12-01 NOTE — ED Provider Notes (Signed)
[facility]   [id number] 11/27/22 Arrival Time: 1025  ASSESSMENT & PLAN:  1. Right wrist pain    Non-traumatic. Likely related to repetitive motions.  Trial of: Discharge Medication List as of 11/27/2022 12:00 PM     START taking these medications   Details  diclofenac (VOLTAREN) 75 MG EC tablet Take 1 tablet (75 mg total) by mouth 2 (two) times daily., Starting Sat 11/27/2022, Normal    predniSONE (DELTASONE) 50 MG tablet Take one tablet by mouth for 5 days., Normal        Orders Placed This Encounter  Procedures   Apply Wrist brace  For comfort. Next week. To remove at times to work on ROM.  Recommend:  Follow-up Information     Schedule an appointment as soon as possible for a visit  with  [facility]   Contact information: [address] [place] [address] [phone]                Reviewed expectations re: course of current medical issues. Questions answered. Outlined signs and symptoms indicating need for more acute intervention. Patient verbalized understanding. After Visit Summary given.  SUBJECTIVE: History from: patient. Sara [name] is a 32 y.o. female who reports right wrist pain, swollen hands and fingers for a few months. 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 She woke up this morning with swelling in three fingers. Better when she is off work. No tx PTA.   Past Surgical History:  Procedure Laterality Date   NO PAST SURGERIES        OBJECTIVE:  Vitals:   11/27/22 1054  BP: (!) 135/98  Pulse: 79  Resp: 16  Temp: 98.4 F (36.9 C)  TempSrc: Oral  SpO2: 97%    General appearance: alert; no distress HEENT: [place]; AT Neck: supple with FROM Resp: unlabored respirations Extremities: RUE: warm with well perfused appearance; poorly localized mild tenderness over right ulnar wrist; without gross deformities;  swelling: none; bruising: none; wrist ROM: normal, with discomfort CV: brisk extremity capillary refill of RUE; 2+ radial pulse of RUE. Skin: warm and dry; no visible rashes Neurologic: gait normal; normal sensation and strength of RUE Psychological: alert and cooperative; normal mood and affect  Imaging: No results found.    No Known Allergies  Past Medical History:  Diagnosis Date   Anxiety    Hidradenitis    Hidradenitis suppurativa    Social History   Socioeconomic History   Marital status: Single    Spouse name: Not on file   Number of children: Not on file   Years of education: Not on file   Highest education level: Not on file  Occupational History   Not on file  Tobacco Use   Smoking status: Every Day    Current packs/day: 0.50    Types: Cigarettes   Smokeless tobacco: Never  Vaping Use   Vaping status: Former  Substance and Sexual Activity   Alcohol use: Yes    Comment: occ   Drug use: Never   Sexual activity: Yes    Birth control/protection: None  Other Topics Concern   Not on file  Social History Narrative   Not on file   Social Determinants of Health   Financial Resource Strain: Not on file  Food Insecurity: Not on file  Transportation Needs: Not on file  Physical Activity: Not on file  Stress:  Not on file  Social Connections: Not on file   Family History  Problem Relation Age of Onset   Cancer Father    Past Surgical History:  Procedure Laterality Date   NO PAST SURGERIES         Mardella Layman, MD 12/01/22 1248
